# Patient Record
Sex: Female | Born: 1940 | Race: White | Hispanic: No | Marital: Married | Smoking: Former smoker
Health system: Southern US, Community
[De-identification: ages and names within clinical notes are randomized; demographics above are authoritative.]

## PROBLEM LIST (undated history)

## (undated) DIAGNOSIS — I10 Essential (primary) hypertension: Secondary | ICD-10-CM

## (undated) DIAGNOSIS — K219 Gastro-esophageal reflux disease without esophagitis: Secondary | ICD-10-CM

## (undated) DIAGNOSIS — R079 Chest pain, unspecified: Secondary | ICD-10-CM

## (undated) DIAGNOSIS — G4733 Obstructive sleep apnea (adult) (pediatric): Secondary | ICD-10-CM

## (undated) HISTORY — PX: OTHER SURGICAL HISTORY: SHX169

## (undated) HISTORY — DX: Essential (primary) hypertension: I10

## (undated) HISTORY — PX: KNEE ARTHROSCOPY: SUR90

## (undated) HISTORY — DX: Gastro-esophageal reflux disease without esophagitis: K21.9

## (undated) HISTORY — DX: Obstructive sleep apnea (adult) (pediatric): G47.33

---

## 1999-02-26 ENCOUNTER — Ambulatory Visit: Admission: RE | Admit: 1999-02-26 | Discharge: 1999-02-26 | Payer: Self-pay | Admitting: Internal Medicine

## 2000-02-12 ENCOUNTER — Encounter: Admission: RE | Admit: 2000-02-12 | Discharge: 2000-02-12 | Payer: Self-pay | Admitting: Internal Medicine

## 2000-02-12 ENCOUNTER — Encounter: Payer: Self-pay | Admitting: Internal Medicine

## 2000-02-18 ENCOUNTER — Encounter: Payer: Self-pay | Admitting: Internal Medicine

## 2000-02-18 ENCOUNTER — Encounter: Admission: RE | Admit: 2000-02-18 | Discharge: 2000-02-18 | Payer: Self-pay | Admitting: Internal Medicine

## 2000-03-18 ENCOUNTER — Encounter: Payer: Self-pay | Admitting: Obstetrics and Gynecology

## 2000-03-21 ENCOUNTER — Inpatient Hospital Stay (HOSPITAL_COMMUNITY): Admission: RE | Admit: 2000-03-21 | Discharge: 2000-03-24 | Payer: Self-pay | Admitting: Obstetrics and Gynecology

## 2000-03-21 ENCOUNTER — Encounter (INDEPENDENT_AMBULATORY_CARE_PROVIDER_SITE_OTHER): Payer: Self-pay

## 2000-03-25 ENCOUNTER — Emergency Department (HOSPITAL_COMMUNITY): Admission: EM | Admit: 2000-03-25 | Discharge: 2000-03-26 | Payer: Self-pay

## 2001-10-24 ENCOUNTER — Encounter: Admission: RE | Admit: 2001-10-24 | Discharge: 2001-10-24 | Payer: Self-pay | Admitting: Gastroenterology

## 2001-10-24 ENCOUNTER — Encounter: Payer: Self-pay | Admitting: Gastroenterology

## 2001-10-25 ENCOUNTER — Inpatient Hospital Stay (HOSPITAL_COMMUNITY): Admission: EM | Admit: 2001-10-25 | Discharge: 2001-10-27 | Payer: Self-pay | Admitting: Gastroenterology

## 2001-12-07 ENCOUNTER — Ambulatory Visit (HOSPITAL_COMMUNITY): Admission: RE | Admit: 2001-12-07 | Discharge: 2001-12-07 | Payer: Self-pay | Admitting: Gastroenterology

## 2002-07-10 ENCOUNTER — Encounter: Payer: Self-pay | Admitting: Internal Medicine

## 2002-07-10 ENCOUNTER — Encounter: Admission: RE | Admit: 2002-07-10 | Discharge: 2002-07-10 | Payer: Self-pay | Admitting: Internal Medicine

## 2002-10-25 ENCOUNTER — Encounter: Admission: RE | Admit: 2002-10-25 | Discharge: 2002-10-25 | Payer: Self-pay | Admitting: Internal Medicine

## 2002-10-25 ENCOUNTER — Encounter: Payer: Self-pay | Admitting: Internal Medicine

## 2004-02-18 ENCOUNTER — Encounter: Admission: RE | Admit: 2004-02-18 | Discharge: 2004-02-18 | Payer: Self-pay | Admitting: Internal Medicine

## 2004-09-09 ENCOUNTER — Encounter: Admission: RE | Admit: 2004-09-09 | Discharge: 2004-09-09 | Payer: Self-pay | Admitting: Internal Medicine

## 2005-03-08 ENCOUNTER — Emergency Department (HOSPITAL_COMMUNITY): Admission: EM | Admit: 2005-03-08 | Discharge: 2005-03-08 | Payer: Self-pay | Admitting: Emergency Medicine

## 2005-05-18 ENCOUNTER — Encounter: Admission: RE | Admit: 2005-05-18 | Discharge: 2005-05-18 | Payer: Self-pay | Admitting: Surgery

## 2005-06-01 ENCOUNTER — Inpatient Hospital Stay (HOSPITAL_COMMUNITY): Admission: RE | Admit: 2005-06-01 | Discharge: 2005-06-06 | Payer: Self-pay | Admitting: Surgery

## 2005-06-01 ENCOUNTER — Encounter (INDEPENDENT_AMBULATORY_CARE_PROVIDER_SITE_OTHER): Payer: Self-pay | Admitting: Specialist

## 2005-06-15 ENCOUNTER — Encounter: Admission: RE | Admit: 2005-06-15 | Discharge: 2005-06-15 | Payer: Self-pay | Admitting: Obstetrics and Gynecology

## 2005-07-20 ENCOUNTER — Encounter: Admission: RE | Admit: 2005-07-20 | Discharge: 2005-07-20 | Payer: Self-pay | Admitting: Surgery

## 2005-10-18 ENCOUNTER — Inpatient Hospital Stay (HOSPITAL_COMMUNITY): Admission: AD | Admit: 2005-10-18 | Discharge: 2005-10-19 | Payer: Self-pay | Admitting: Surgery

## 2005-10-19 ENCOUNTER — Encounter (INDEPENDENT_AMBULATORY_CARE_PROVIDER_SITE_OTHER): Payer: Self-pay | Admitting: *Deleted

## 2005-12-23 ENCOUNTER — Encounter: Admission: RE | Admit: 2005-12-23 | Discharge: 2005-12-23 | Payer: Self-pay | Admitting: Internal Medicine

## 2007-01-05 ENCOUNTER — Encounter: Admission: RE | Admit: 2007-01-05 | Discharge: 2007-01-05 | Payer: Self-pay | Admitting: Internal Medicine

## 2007-01-24 ENCOUNTER — Ambulatory Visit: Payer: Self-pay | Admitting: Internal Medicine

## 2007-02-03 ENCOUNTER — Ambulatory Visit: Payer: Self-pay | Admitting: Internal Medicine

## 2007-02-06 ENCOUNTER — Ambulatory Visit: Payer: Self-pay | Admitting: Internal Medicine

## 2007-06-09 ENCOUNTER — Encounter: Payer: Self-pay | Admitting: Internal Medicine

## 2007-06-09 DIAGNOSIS — I10 Essential (primary) hypertension: Secondary | ICD-10-CM | POA: Insufficient documentation

## 2007-06-17 ENCOUNTER — Encounter: Payer: Self-pay | Admitting: Internal Medicine

## 2009-03-07 ENCOUNTER — Encounter: Admission: RE | Admit: 2009-03-07 | Discharge: 2009-03-07 | Payer: Self-pay | Admitting: Internal Medicine

## 2009-12-01 ENCOUNTER — Emergency Department (HOSPITAL_COMMUNITY): Admission: EM | Admit: 2009-12-01 | Discharge: 2009-12-01 | Payer: Self-pay | Admitting: Emergency Medicine

## 2010-06-25 ENCOUNTER — Other Ambulatory Visit: Payer: Self-pay | Admitting: Internal Medicine

## 2010-06-26 ENCOUNTER — Ambulatory Visit
Admission: RE | Admit: 2010-06-26 | Discharge: 2010-06-26 | Disposition: A | Discharge status: Home / Self Care | Payer: Medicare Other | Source: Ambulatory Visit | Attending: Internal Medicine | Admitting: Internal Medicine

## 2010-08-01 LAB — DIFFERENTIAL
Lymphocytes Relative: 35 % (ref 12–46)
Lymphs Abs: 2.5 10*3/uL (ref 0.7–4.0)
Monocytes Relative: 9 % (ref 3–12)

## 2010-08-01 LAB — URINALYSIS, ROUTINE W REFLEX MICROSCOPIC
Hgb urine dipstick: NEGATIVE
Nitrite: NEGATIVE
Specific Gravity, Urine: 1.015 (ref 1.005–1.030)
Urobilinogen, UA: 0.2 mg/dL (ref 0.0–1.0)

## 2010-08-01 LAB — COMPREHENSIVE METABOLIC PANEL
Albumin: 3.8 g/dL (ref 3.5–5.2)
Alkaline Phosphatase: 39 U/L (ref 39–117)
CO2: 24 mEq/L (ref 19–32)
Chloride: 103 mEq/L (ref 96–112)
GFR calc Af Amer: >60 mL/min (ref >60)
GFR calc non Af Amer: >60 mL/min (ref >60)
Sodium: 136 mEq/L (ref 135–145)
Total Bilirubin: 0.4 mg/dL (ref 0.3–1.2)
Total Protein: 6.5 g/dL (ref 6.0–8.3)

## 2010-08-01 LAB — CBC
RBC: 4.3 MIL/uL (ref 3.87–5.11)
WBC: 7.4 10*3/uL (ref 4.0–10.5)

## 2010-08-01 LAB — LACTIC ACID, PLASMA: Lactic Acid, Venous: 0.8 mmol/L (ref 0.5–2.2)

## 2010-09-29 NOTE — Assessment & Plan Note (Signed)
[facility]                             PULMONARY OFFICE NOTE   NAME:[name], [name]                  MRN:          [id number]  DATE:01/24/2007                            DOB:          [birth year]    REASON FOR CONSULTATION:  Pulmonary infiltrates and dyspnea.   HISTORY:  A 70 year old white female who says she was perfectly well  until arrived in [place] on August 4 to watch her grandson play  baseball.  Six days after that she began having headache, hacking cough  with fever, sore throat and then began producing green mucus.  She was  initially started on a Z-Pak, switched over to Avelox and now is on  doxycycline and feels 300% better.  The only thing left is sensation  that she is not getting a deep breath on the left lung.  This led to a  workup including a CT scan suggesting bilateral ground glass changes and  I was asked to see her by Dr. Earl Gala.   Note that all of her acute symptoms have resolved including the green  mucus and cough.  She denies any significant dyspnea presently with  exertion, orthopnea, PND, myalgias, arthralgias, fevers, chills, sweats,  orthopnea, PND or leg swelling, overt sinus or reflux symptoms.  Her  symptoms of congestion in the left chest seem to be worse when she  lies down at night or on her left side.   PAST MEDICAL HISTORY:  1. Hypertension.  2. Multiple knee surgeries.   ALLERGIES:  PENICILLIN.   MEDICATIONS:  Taken in detail on the worksheet column dated January 24, 2007 and significant for the fact she is on a prednisone taper.   SOCIAL HISTORY:  She has quit smoking in 1980, had no respiratory  complaints then.  She has worked as an Production designer, theatre/television/film.   FAMILY HISTORY:  Positive for cancer of the lung in her brother.  Her  mother had breast cancer, metastatic to lung apparently.   REVIEW OF SYSTEMS:  Taken in detail on the worksheet, negative except as  outlined above.   PHYSICAL  EXAMINATION:  This is a pleasant ambulatory mildly obese white  female in no acute distress.  She was afebrile, normal vital signs.  HEENT:  Unremarkable, nasal turbinates normal, oropharynx is clear,  dentition is intact, ear canals are clear bilaterally.  NECK:  Supple without cervical adenopathy or tenderness, trachea is  midline, no thyromegaly.  LUNGS:  Fields perfectly clear bilaterally to auscultation and  percussion.  Regular rate and rhythm without murmur, gallop or rub.  ABDOMEN:  Soft, benign without palpable organomegaly, masses or  tenderness.  EXTREMITIES:  Warm without calf tenderness, cyanosis, clubbing or edema.   Hemoglobin saturation of 94% on room air.   CT scan of the chest was reviewed from August 21 and shows bilateral  very subtle ill-defined ground glass changes.  Chest x-ray today was  essentially normal and the area on the left lung that was previously  seen on plain film.   IMPRESSION:  Resolving bronchopneumonia of unclear etiology.  She was in  [place] when  this occurred which probably has nothing to do with her  symptoms but rather had just traveled by air which I believe is a  significant risk factor for respiratory tract infections of all type and  also flare up of sinus disease, although that does not particularly fit  this patient.  I agree completely with Dr. Newell Coral empiric approach  and simply recommended the patient return here in 2 weeks is not 100%  baseline.  In the meantime I recommended the following.  I think it is fine to stop all of her respiratory medications and also  reminded her not to use cough drops, mint or menthol products since  these promote reflux which exacerbate cough and congestion, her main  residual respiratory complaint.  For cough and congestion I think it is  fine to use Mucinex DM 2 b.i.d. but no need for Ventolin here.  She  should finish her prednisone and doxycycline and continue to take  Protonix 30-60  minutes before her first meal daily along with Zegerid 40  mg at bedtime since she says some of her symptoms are worse when she  lies down but I only gave her 15 days and at the end of 2 weeks if she  is not 100% back to baseline (note she said she was 300% of baseline  which indicates to me she does not understand the question so one has to  be careful interpreting the answer.  I have recommended she return here  for further followup otherwise followup p.r.n.     Diana [name]. Sherene Sires, MD, [facility]  Electronically Signed    MBW/MedQ  DD: 01/24/2007  DT: 01/25/2007  Job #: [id number]   cc:   Theressa Millard, M.D.

## 2010-09-29 NOTE — Assessment & Plan Note (Signed)
[facility]                             PULMONARY OFFICE NOTE   Diana [name], Diana [name]                  MRN:          [id number]  DATE:02/03/2007                            DOB:          Aug 18, 1940    REFERRING PHYSICIAN:  Theressa Millard, M.D.   This is a pulmonary extended summary final followup office visit.   HISTORY:  This is a 70 year old white female seen at Dr. Newell Coral  request for coughing, dyspnea, and shortness of breath that all began  abruptly after a trip to [place] in August.  She had very minimal  ground glass changes on CT scan on August 21st, but what was reported to  me she was 300% better.  In fact today she says she was never more  than 95% better and misunderstood the question.  The 5% that is still  left is she has a little bit of cough in the morning when she gets up  with thick mucus and dyspnea with heavy exertion.  She denies any  orthopnea, PND, or leg swelling.  She is no longer needing any form of  inhalers.  I recommended Mucinex DM p.r.n. cough but she has not  actually filled the prescription.  She is on maximum treatment directed  at reflux with both Protonix in the morning and Zegerid at bedtime and  feels much better in terms of nighttime symptoms with no more chest  congestion when she lies down at night.  Note that she finished  prednisone just 3 days ago and has not lost any ground in terms of any  symptoms since the prednisone was stopped.   PHYSICAL EXAMINATION:  GENERAL:  She is a pleasant, ambulatory.  moderately obese, white female in no acute distress.  VITAL SIGNS:  Afebrile with normal vital signs.  HEENT:  Remarkably unremarkable.  Nasal turbinates are normal.  Oropharynx is clear.  NECK:  Supple without cervical adenopathy or tenderness.  Trachea is  midline.  No thyromegaly.  LUNGS:  Fields perfectly clear bilaterally on auscultation percussion  with no cough elicited on inspiratory or  expiratory maneuvers .  HEART:  There is a regular rhythm without murmur, gallop, or rub.  ABDOMEN:  Soft, benign.  EXTREMITIES:  Warm without calf tenderness, cyanosis, clubbing, edema.   Chest x-ray is pending.   IMPRESSION:  A somewhat atypical upper respiratory tract infection with  infiltrates on chest x-ray that would suggest a component of pneumonia,  although the clinical course is a bit unusual and it suggests the  possibility of other entities such as sinusitis with intermittent post  nasal drip syndrome or even one of the eosinophilic lung syndromes or  BOOP.   I have not actually done any blood work on her and one reason I did not  is because she continues to report that she is 95% better compared to  when she was at her worst.  Obviously, if she looses ground off of  prednisone, it would indicate a more extensive workup is necessary which  probably should include a repeat CT scan of the chest along with CBC  with differential looking for eosinophils and a sed rate.   For now, I simply reiterated the recommendations I made previously and  that is to continue to treat reflux aggressively (note that all of her  nighttime chest symptoms were eliminated on Zegerid at bedtime which  probably she should continue and at this point stop the Protonix in the  morning.  If she is short of breath, she can certainly use Ventolin.  If  she is coughing, she should use the Mucinex DM 2 b.i.d.  But if she is  not 100% better by the end of 2 weeks, I would proceed with the above  workup and I have offered to either have her seen here to arrange this  or alternatively she could certainly see Dr. Earl Gala for the above  workup.     Charlaine Dalton. Sherene Sires, MD, [facility]  Electronically Signed    MBW/MedQ  DD: 02/03/2007  DT: 02/03/2007  Job #: [id number]   cc:   Theressa Millard, M.D.

## 2010-10-02 NOTE — Op Note (Signed)
Diana [name], Diana [name]           ACCOUNT NO.:  [id number]   MEDICAL RECORD NO.:  [id number]          PATIENT TYPE:  INP   LOCATION:  5708                         FACILITY:  [facility]   PHYSICIAN:  John C. Madilyn Fireman, M.D.    DATE OF BIRTH:  1940/11/03   DATE OF PROCEDURE:  10/19/2005  DATE OF DISCHARGE:  10/19/2005                                 OPERATIVE REPORT   PROCEDURE:  Colonoscopy with biopsy.   INDICATIONS FOR PROCEDURE:  Hematochezia.   DESCRIPTION OF PROCEDURE:  The patient was placed in the left lateral  decubitus position and placed on the pulse monitor with continuous low-flow  oxygen delivered by nasal cannula.  She was sedated with 35 mcg IV fentanyl  and 4 mg IV Versed.  The Olympus video colonoscope was inserted into the  rectum and advanced to the cecum, confirmed by transillumination of  McBurney's point and visualization of the ileocecal valve and appendiceal  orifice.  The prep was excellent.  The cecum, ascending, transverse, and  descending colon all appeared normal with no masses, polyps, diverticula or  other mucosal abnormalities.  Within the sigmoid colon, there was a small  polyp that was fulgurated by hot biopsy.  5 cm distal to this was the  surgical anastomosis with lots of suture material and granulation tissue and  one cherry red spot just adjacent to the suture with no active bleeding.  There was no blood seen anywhere in the colon.  No biopsies were taken.  The  mucosa distal to the anastomosis appeared normal all the way down to the  rectum where a retroflex view of the anus revealed no obvious internal  hemorrhoids.  The scope was then withdrawn and the patient returned to the  recovery room in stable condition.  She tolerated the procedure well.  There  were no immediate complications.   IMPRESSION:  1.  Small sigmoid polyp.  2.  Granulation tissue with possible stigma of hemorrhage at previous      surgical anastomosis.   PLAN:  Await biopsy  results and observe for further bleeding.           ______________________________  Everardo All. Madilyn Fireman, M.D.     JCH/MEDQ  D:  10/19/2005  T:  10/19/2005  Job:  [id number]   cc:   Sandria Bales. Ezzard Standing, M.D.  [address], [address]  [place]  [place] [address]

## 2010-10-02 NOTE — Op Note (Signed)
NAMEVONYA, Diana [name]           ACCOUNT NO.:  [id number]   MEDICAL RECORD NO.:  [id number]          PATIENT TYPE:  INP   LOCATION:  X006                         FACILITY:  [facility]   PHYSICIAN:  Sandria Bales. Ezzard Standing, M.D.  DATE OF BIRTH:  09-Feb-1941   DATE OF PROCEDURE:  06/01/2005  DATE OF DISCHARGE:                                 OPERATIVE REPORT   PREOPERATIVE DIAGNOSIS:  Recurrent sigmoid colon/left colon diverticulitis.   POSTOPERATIVE DIAGNOSIS:  Recurrent sigmoid colon/left colon diverticulitis.   PROCEDURE:  Laparoscopic-assisted sigmoid/distal left colon resection with  mobilization of splenic flexure laparoscopically.   SURGEON:  Dr. Ezzard Standing   FIRST ASSISTANT:  Dr. Baruch Merl   ANESTHESIA:  General endotracheal.   ESTIMATED BLOOD LOSS:  200 mL.   DRAINS LEFT IN:  None.   INDICATIONS FOR PROCEDURE:  Diana [name] is a 70 year old white female, who  has had recurrent diverticular attacks in the last 2-3 years.  She now comes  for laparoscopic-assisted/ sigmoid/left colon resection.  She underwent a  barium enema on the 2nd of January which shows that most of her diverticular  disease is in the sigmoid colon and distal left colon.   The indications and potential complications of the operation were explained  to the patient.  Potential complications including, but not limited to,  bleeding, infection, leakage of bowel, recurrent diverticular disease.   DESCRIPTION OF OPERATION:  The patient placed in a lithotomy position with  both of her arms tucked to her side.  She had prior knee surgery, and we  were careful in trying to position her to not put any stress on her knees.  She had a Foley catheter in place, an NG tube in place.  Her abdomen and  perineum were prepped with Betadine solution and sterilely draped.   I started out with an infraumbilical incision, placing a 12 mm Hasson  trocar.  I secured this with 0 Vicryl suture.  I then used a 10 mm zero and  angled  scopes, and I placed three 5 mm trocars, 1 in the left lower  quadrant, 1 in the right lower quadrant, and 1 right upper quadrant.  The  patient had some adhesions of her omentum to her anterior abdominal wall  which were taken down with the harmonic scalpel.  I then identified the  sigmoid colon, mobilized the sigmoid colon up over the pelvic brim.  I  mobilized the left colon along the left colonic gutter, identified the  splenic flexure.  I took the omentum off of the left transverse colon for  about 15-20 cm.  I mobilized the splenic flexure down so I had most of the  left colon mobilized.  At this time, I then converted to an open operation  to try to keep my incision below the umbilicus.  I went through a lower  midline incision. I then went down and freed up her sigmoid colon which was  kind of curled in the pelvis.  This was due, I think, both to her prior  diverticular disease and prior hysterectomy.   At this point, we were well off posterior  dissection and this was  diverticular disease, we really were not going very far down on the  mesentery, so I really stayed superficial of the mesentery to stay well away  from the ureter.  I found what I thought was the most distal diverticula  which was about 7-8 cm proximal to the peritoneal reflection.  I divided the  distal sigmoid colon to this region.  I then divided the mesentery with  Kelly clamps and 2-0 silk ties, got to the proximal bowel, which was I think  sort of mid left colon.  I thought this went easily down to where I divided  the distal bowel.  I marked the proximal end of the resected specimen with a  suture.   I then did an end-to-end hand sewn anastomosis with 2-0 silk suture.  I  thought there was no tension on the anastomosis.  I then placed stitches on  both sides of the anastomosis to kind of support the anastomosis.  At this  point, Dr. Colin Benton went below, did a sigmoidoscopy on the patient.  She did  not  visualize it, but she did pump up the rectum with air while I put a  clamp proximal to the anastomosis, and I submerged this under water.  There  was no leakage of any air at the anastomosis.  The anastomosis looked viable  with good blood supply.  There was no leak with the under water air test.   I then irrigated the abdomen with about 3-4 L of saline.  She had 1 little  bleeder to the right distal of the colon.  These were oversewn with a couple  of silk sutures.  I then made sure the NG tube was in good position in the  stomach.  The small bowel was returned to its normal location.  The omentum  laid under the incision.  Sponge and needle count were correct at the end of  the case.  I then closed the abdomen with 2 running #1 PDS sutures with some  interrupted #1 PDS sutures.   The skin was then closed with skin guns, and then I placed a couple of Telfa  wicks in the wound.   The patient tolerated the procedure well and was transported to the recovery  room in good condition.  Sponge and needle count were correct at the end of  the case.      Sandria Bales. Ezzard Standing, M.D.  Electronically Signed     DHN/MEDQ  D:  06/01/2005  T:  06/01/2005  Job:  [id number]   cc:   Theressa Millard, M.D.  Fax: [phone]   Danise Edge, M.D.  Fax: [phone]

## 2010-10-02 NOTE — Discharge Summary (Signed)
[facility]  Patient:    Diana [name], Diana [name]                MRN: [id number] Adm. Date:  [id number] Disc. 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  OPERATION PERFORMED:  Burch procedure and posterior repair.  BRIEF HISTORY:  Ms. Azbell is a 70 year old female status post hysterectomy with persistent genuine stress urinary incontinence and a large rectocele for repair. Significant in her history is that she had mild hypertension and a history of a pulmonary embolus postop and a hysterectomy.  Initial physical examination revealed only the urethral mobility and a large rectocele.  LABORATORY DATA:  Hemoglobin on admission was 14, coagulation profile was normal.  Chest x-ray and cardiogram preoperatively were normal.  HOSPITAL COURSE:  The patient was admitted to the hospital, underwent an uneventful burch procedure and posterior repair. Her postoperative course was uncomplicated. On the day of discharge, she was having a lot of frequency and cath urine revealed a high residual urine so we inserted the Foley and sent her home with Cipro 500 mg daily and she will remove it on Monday when we take her sutures out. She will continue taking her Percocet for pain and her Ambien for sleep. She will call for any other discomfort or problems she may have.  CONDITION ON DISCHARGE:  Improved. DD:  04/06/00 TD:  04/08/00 Job: [id number]

## 2010-10-02 NOTE — Discharge Summary (Signed)
NAMELYSA, LIVENGOOD           ACCOUNT NO.:  [id number]   MEDICAL RECORD NO.:  [id number]          PATIENT TYPE:  INP   LOCATION:  1615                         [facility]   PHYSICIAN:  Sandria Bales. Ezzard Standing, M.D.  DATE OF BIRTH:  1940/10/30   DATE OF ADMISSION:  06/01/2005  DATE OF DISCHARGE:  06/06/2005                                 DISCHARGE SUMMARY   DISCHARGE DIAGNOSES:  1.  Diverticulosis of left/sigmoid colon.  2.  Hypertension.  3.  Arthritic complaints of knees.  4.  Remote history of deep vein thrombosis.   OPERATIONS PERFORMED:  The patient underwent a laparoscopic-assisted  sigmoid/distal left colon resection with mobilization of the splenic flexure  on June 01, 2005.   HISTORY OF PRESENT ILLNESS:  Ms. Klar is a 70 year old white female,  patient of Dr. Benjaman Kindler, who has had recurrent diverticulitis over the  last two to three years. I actually took care of her mother, Kristeen Mans  Joanette Gula, who is now dead.   She has had multiple treatments with antibiotics and has had increasingly  frequent symptoms. She had a CT scan report dated February 18, 2004 which  noted sigmoid colon diverticulosis. She said she had a colonoscopy by Dr.  Laural Benes in 2004 where she was found to have some polyps but otherwise had a  benign colonoscopy exam.   She now comes for attempted colon resection for recurrent diverticulitis.   PAST MEDICAL HISTORY:  1.  She did have a history of DVT after hysterectomy about 20 years ago. She      was placed on Coumadin for a period of time but has done well since that      time.  2.  She has had knee replacements at [facility].  3.  She has had hypertension but never had any clear chest pain.   Ms. Ringgenberg completed an antibiotic bowel prep at home and came to the  hospital on January 16 where she underwent a laparoscopically assisted  sigmoid/distal left colon resection with mobilization of the splenic  flexure.   Postoperatively, she did  well. On her first postoperative day, her  hemoglobin was 11, white blood count of 11,100, sodium 138, 4.0, creatinine  0.9. She was placed on DVT prophylaxis during her hospitalization. Her NG  tube was removed on the second postoperative day. By the fourth  postoperative day, she had a bowel movement, was started on clear liquids  which she tolerated, and by the fifth postoperative day, she was ready for  discharge.   She was sent home on Vicodin for pain. She should slowly increase her  activity. No driving for a couple of days. She will see me back in about  one's week for wound check. She was call to any interval problems or  questions.   Her final pathology showed colon segmental resection with diverticulosis but  no malignancy.   Her discharge condition was good.      Sandria Bales. Ezzard Standing, M.D.  Electronically Signed     DHN/MEDQ  D:  06/16/2005  T:  06/16/2005  Job:  [id number]   cc:  Theressa Millard, M.D.  Fax: [phone]   Danise Edge, M.D.  Fax: [phone]

## 2010-10-02 NOTE — Consult Note (Signed)
Diana [name], Diana [name]           ACCOUNT NO.:  [id number]   MEDICAL RECORD NO.:  [id number]          PATIENT TYPE:  INP   LOCATION:  5708                         FACILITY:  [facility]   PHYSICIAN:  Bernette Redbird, M.D.   DATE OF BIRTH:  01/08/1941   DATE OF CONSULTATION:  DATE OF DISCHARGE:                                   CONSULTATION    <GASTROENTEROLOGY  CONSULTATION/>  Dr. Ezzard Standing asked [place] to see this 70 year old female because of lower GI  bleeding.   Ms. Filice is approximately 6 months status post a laparoscopically-  assisted sigmoid colectomy for recurrent diverticulitis, but it is known  that residual diverticular disease was left behind.   The patient has never had frank GI bleeding although she has seen some self-  limited rectal bleeding from time to time of the past.   With that background, she was in her usual state of health until about 12  noon today when she had several bowel movements of fairly large amounts of  blood mixed with stool which was loose in character, and associated with  moderately severe abdominal cramps.  The bleeding then tapered off, but  seemed to start recurring again this evening, with the amount increasing as  recently as 30 minutes ago.  She has not had any orthostatic symptomatology.  In fact, her hemoglobin on presentation to the hospital here is 13.5.   PAST MEDICAL HISTORY:  Allergy to PENICILLIN (difficulty breathing and  severe swelling).   CURRENT MEDICATIONS:  Atenolol, HCTZ and estradiol.   PAST SURGICAL HISTORY:  Hysterectomy, total knee replacement, sigmoid  colectomy.   PAST MEDICAL HISTORY:  Hypertension, but no known cardiopulmonary disease or  diabetes.   HABITS:  Rare ethanol, but nonsmoker.   FAMILY HISTORY:  Diverticular disease in her father who may also have had  colon cancer (details unclear), but he died of a heart attack.   SOCIAL HISTORY:  The patient is married and accompanied by her husband in  the  hospital room.  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   REVIEW OF SYSTEMS:  Over the past month, the patient has had a tendency  toward reflux or indigestion, treated with Tums.  No problem with  constipation (reports a normal daily bowel movement at baseline).  No  trouble swallowing, no ongoing intestinal symptoms other than the above-  mentioned heartburn.   PHYSICAL EXAMINATION:  GENERAL:  A pleasant, articulate Caucasian female in  no evident distress.  Neither anxious nor depressed.  She is without pallor  or icterus.  VITAL SIGNS:  Vital signs include blood pressure 197/91, pulse 65, afebrile,  respirations 20 and nonlabored.  SKIN:  The skin is warm and dry.  The radial pulses full and slow consistent  with a atenolol usage.  CHEST:  Clear.  HEART:  Normal.  ABDOMEN:  Abdomen has normal bowel sounds and no organomegaly, no guarding,  mass or tenderness.  It is a very benign abdomen.  RECTAL:  Exam shows absolutely no blood or  stool, just mucoid clear residue   LABORATORY DATA:  Labs white count 6300, hemoglobin 13.5 with MCV of 90,  platelets 263,000.  INR 0.9, BUN 14 with creatinine of 0.8.  Liver  chemistries normal.   IMPRESSION:  1.  Recurrent hematochezia without significant hemodynamic instability or      significant drop in hemoglobin.  2.  Significant crampy lower abdominal pain in association with the above-      mentioned bleeding.  3.  Status post sigmoid colectomy for recurrent diverticulitis, with history      of known residual diverticular disease.   DISCUSSION AND PLAN:  The overall picture to me sounds somewhat suggestive  of ischemic colitis in view of the abrupt onset and a large component of  pain in association with a relatively small volume of blood loss (as  indicated by the normal hemoglobin on admission, and the hemodynamic  stability).  This could  certainly be a diverticular hemorrhage, but the  quantity of blood loss does not seem to be sufficient for the typical  diverticular bleed.  Infectious colitis will be another possibility although  the volume of the diarrhea, in relation to the degree of cramps and  bleeding, does not seem to be sufficient.   RECOMMENDATIONS:  I would favor colonoscopic evaluation to help clarify the  clinical picture.  The patient is familiar with the procedure from when Dr.  Laural Benes did a colonoscopy several years ago and does wish to proceed.  We  will initiate gentle prep tonight, with the anticipation that not that  extensive of a prep will be needed to clear her out sufficiently for a  colonoscopy examination.   We appreciate the opportunity to have seen this patient in consultation with  you.           ______________________________  Bernette Redbird, M.D.     RB/MEDQ  D:  10/18/2005  T:  10/19/2005  Job:  [id number]   cc:   Sandria Bales. Ezzard Standing, M.D.  [address]  [place]  [place] [address]   Theressa Millard, M.D.  Fax: [phone]   Danise Edge, M.D.  Fax: [phone]

## 2010-10-02 NOTE — H&P (Signed)
[facility]  Patient:    Diana [name], Diana [name] Visit Number: [id number] MRN: [id number]          Service Type: EMS Location: ED Attending Physician:  Shelba Flake Dictated by:   Everardo All Madilyn Fireman, M.D. Admit Date:  10/24/2001 Discharge Date: 10/25/2001   CC:         Verlin Grills, M.D.  Tyson Dense, M.D.   History and Physical  CHIEF COMPLAINT:  Abdominal pain.  HISTORY OF PRESENT ILLNESS:  The patient is a 70 year old white female admitted with a two-day history of worsening lower abdominal pain, feverishness, and malaise.  She was seen yesterday by Dr. Eula Listen and Dr. Laural Benes, and diverticulitis was suspected and the patient started on p.o. Cipro.  An abdominal CT scan was obtained this evening, and I received a call from the radiologist stating that it showed significant diverticulitis with some air in the bowel wall.  She was instructed to come to the emergency room for admission.  She denies any vomiting.  She has had some nausea and some mild abdominal distention.  She has not had any severe fecal obstipation, diarrhea, or rectal bleeding.  Denies any urinary symptoms.  She has never had diverticulitis in the past.  PAST MEDICAL HISTORY: 1. Hypertension. 2. History of DVT after gynecologic surgery.  PAST SURGICAL HISTORY: 1. Bilateral knee surgeries in the past. 2. Repair of vaginal prolapse, TAH with BSO and incidental appendectomy.  MEDICATIONS: 1. Atenolol 50 mg a day. 2. Hydrochlorothiazide 25 mg a day. 3. Estrogen replacement.  ALLERGIES:  None.  SOCIAL HISTORY:  The patient drinks alcohol occasionally.  She denies cigarette smoking.  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  FAMILY HISTORY:  Her mother died of lung and breast cancer.  Father had coronary artery disease.  PHYSICAL EXAMINATION:  VITAL SIGNS:  Temperature 98.7, blood pressure 132/66, heart rate 96, respirations 18.  GENERAL:  A  well-developed, well-nourished white female in no acute distress.  HEENT:  Unremarkable.  CHEST:  Clear.  CARDIAC:  Regular rate and rhythm without murmur.  ABDOMEN:  Soft, nondistended, with normoactive bowel sounds.  There is moderate lower abdominal tenderness and moderate rebound tenderness.  EXTREMITIES:  Without cyanosis, clubbing, or edema.  LABORATORY DATA:  Hemoglobin 13.7, hematocrit 40, WBC 10,300, platelets 249,000.  Chemistries pending.  IMPRESSION:  Diverticulitis with air inside the bowel, possible developing abscess.  PLAN:  Admit for IV antibiotics, bowel rest, and probable surgical consultation.  She will probably need a repeat CT scan for decisions regarding ongoing medical versus surgical therapy. Dictated by:   Everardo All Madilyn Fireman, M.D. Attending Physician:  Shelba Flake DD:  10/24/01 TD:  10/26/01 Job: [id number]

## 2010-10-02 NOTE — H&P (Signed)
[facility]  Patient:    Diana [name], Diana [name]                    MRN: [id number] Attending:  Katherine Roan, M.D.                         History and Physical  CHIEF COMPLAINT:  Stress urinary incontinence and pelvic pressure.  HISTORY OF PRESENT ILLNESS:  Diana [name] is status post TAH, LSO, and right salpingectomy in 1984, who has a large rectocele and stress incontinence.  She has urge and stress incontinence and pelvic pressure with the feeling that things are falling out.  Because of this, she is prepared for surgery.  Her comorbidity include mild hypertension, esophageal reflux, and a history of pulmonary embolus.  She is a gravida 4, para 61, 70 years old.  MEDICATIONS:  Detrol, atenolol, HydroDIURIL, Prevacid, and Estrace.  ALLERGIES:  NEOSPORIN.  Posthysterectomy, she had a pulmonary embolus, but that was about two weeks postop.  REVIEW OF SYSTEMS:  HEENT:  She wears glasses.  She has no decrease in vision or auditory acuity.  No headaches or dizziness.  HEART:  She is followed for mild hypertension well controlled on atenolol and HydroDIURIL.  No chest pain. No shortness of breath.  No history of rheumatic fever.  No history of heart murmur.  LUNGS:  No chronic cough, no asthma.  She has a history of a pulmonary embolus posthysterectomy in 1984.  GU:  She has stress urinary incontinence, pelvic pressure, and some urge.  She has difficulty with pelvic pressure and feeling that things are falling out.  GI:  No bowel habit change. No weight loss or gain.  No melena.  No decrease in appetite. MUSCLES/BONES/JOINTS:  She has a history of knee surgery.  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  FAMILY HISTORY:  Her mother is living and well at 61.  Father deceased at 23. Father had a mild cardial infarction apparently.  She has one sister and one brother who are in good health.  Mother has cancer of the breast and lung. She  has maternal aunts and uncles with diabetes.  PHYSICAL EXAMINATION:  GENERAL:  Well-developed, well-nourished female who appears to be her stated age of 70.  Weight is 193.  VITAL SIGNS:  Blood pressure is 130/80/  HEENT:  Unremarkable.  Oropharynx is not injected.  NECK:  Supple.  Carotid pulses are equal without bruits.  Trachea is in the midline, and thyroid does not appear to be enlarged.  I detect no adenopathy.  BREASTS:  No masses or tenderness.  Axilla free from adenopathy.  Mammograms have been done on a yearly basis.  LUNGS:  Clear to percussion and auscultation.  Diaphragm sits well with inspiration and expiration.  HEART:  Normal sinus rhythm.  No murmur.  ABDOMEN:  Soft, liver, spleen, nor kidneys are not palpated.  Bowel sounds are normal with no tenderness.  There is a low transverse incision.  EXTREMITIES:  Examination of the extremities show good range of motion and equal pulses and reflexes.  PELVIC:  Urethral mobility, minimal cystocele, and a fairly large rectocele. This extends all the way up to the vaginal apex.  There appears to be no enterocele.  Hemoccult is negative, and no pelvic masses are noted.  IMPRESSION:  Symptomatic stress incontinence and urge and large rectocele.  PLAN:  Burch procedure and posterior repair.  Risks and benefits have been discussed with Lurena Joiner. DD:  03/21/00 TD:  03/21/00 Job: [id number]

## 2010-10-02 NOTE — Op Note (Signed)
[facility]  Patient:    Diana [name], Diana [name]                MRN: [id number] Proc. Date: 03/21/00 Adm. Date:  [id number] Attending:  Lendon Colonel                           Operative Report  PREOPERATIVE DIAGNOSES:  Symptomatic stress urinary incontinence with rectocele, pelvic pressure and advanced stage rectocele and urethral mobility.  POSTOPERATIVE DIAGNOSES:  Symptomatic stress urinary incontinence with rectocele, pelvic pressure and advanced stage rectocele and urethral mobility.  OPERATION PERFORMED:  Burch procedure and posterior repair.  DESCRIPTION OF PROCEDURE:  The patient was placed in lithotomy position, prepped and draped in the usual fashion. A transverse incision was made in the abdomen and carried down to the layers of the fascia which was incised transversely. The rectus insertions were identified and severed and then the retropubic space was entered. Visualization of the retropubic space was accomplished with blunt and sharp dissection. The vagina right at the bladder neck was then identified and dissected and with a finger in the vagina, 2 Ethibond sutures were placed in the paravaginal tissue to the inguinal ligament and the shelving edge of Pouparts ligament. This was done on each side with good elevation of the bladder neck. Irrigation of the retropubic space, the rectus muscles were then attached to the lower transverse fascia of the abdominal wall. The fascia was then closed with a running suture of 2-0 PDS and interrupted Ethibond. Hemostasis appeared secure. The skin was closed with clips. Attention was then drawn to the posterior repair where a wedge of the perineal body was removed and the rectum was then dissected from the underlying vagina. The rectal defect was a complex defect both linear and transverse and was closed with interrupted sutures of 3-0 Vicryl. This affected an excellent closure. Following this,  the vagina was excised and closed with a running locking suture of 2-0 chromic and the vagina was packed with Iodoform pack. The perineal skin was closed with a subcuticular 3-0 chromic and interrupted sutures of 2-0 Vicryl. The abdominal incision was infiltrated with 0.5% Marcaine and the posterior repair was irrigated with 1% xylocaine. [name] tolerated this procedure well and was sent to the recovery room in good condition. DD:  03/21/00 TD:  03/21/00 Job: [id number]

## 2010-10-02 NOTE — Procedures (Signed)
[place]  Patient:    Diana [name], Diana [name] Visit Number: [id number] MRN: [id number]          Service Type: END Location: ENDO Attending Physician:  Dennison Bulla Ii Dictated by:   Verlin Grills, M.D. Proc. Date: 12/07/01 Admit Date:  12/07/2001 Discharge Date: 12/07/2001   CC:         Theressa Millard, M.D.   Procedure Report  PROCEDURE:  Colonoscopy.  REFERRING PHYSICIAN:  Theressa Millard, M.D.  INDICATIONS FOR PROCEDURE:  The patient is a 70 year old female born 28-Mar-1941.  The patient was hospitalized to treat acute diverticulitis approximately four to six weeks ago.  She is scheduled to undergo a follow up diagnostic colonoscopy.  While receiving the colonic lavage prep, she developed hematochezia which has resolved.  ENDOSCOPIST:  Verlin Grills, M.D.  PREMEDICATION:  Versed 5 mg and fentanyl 50 mcg.  ENDOSCOPE:  Olympus pediatric colonoscope.  DESCRIPTION OF PROCEDURE:  After obtaining informed consent, the patient was placed in the left lateral decubitus position.  I administered intravenous Versed and intravenous fentanyl to achieve conscious sedation for the procedure.  The patients blood pressure, oxygen saturation, and cardiac rhythm were monitored throughout the procedure, and documented in the medical record.  Anal inspection was normal.  Digital rectal exam was normal.  The Olympus pediatric video colonoscope was introduced into the rectum and advanced to the cecum.  Colonic preparation for the exam today is satisfactory.  Rectum:  Large nonbleeding internal hemorrhoids, normal rectal mucosa.  Sigmoid colon and descending colon:  Left colonic diverticulosis without diverticulitis or diverticular stricture formation.  Splenic flexure normal.  Transverse colon normal.  Hepatic flexure normal.  Ascending colon normal.  Cecum and ileocecal valve normal.  ASSESSMENT: 1. Large internal  hemorrhoids. 2. Left colonic diverticulosis. 3. No endoscopic evidence for the presence of colorectal neoplasia. Dictated by:   Verlin Grills, M.D. Attending Physician:  Dennison Bulla Ii DD:  12/07/01 TD:  12/10/01 Job: [id number]

## 2010-10-02 NOTE — Discharge Summary (Signed)
NAMEHANIA, CERONE           ACCOUNT NO.:  [id number]   MEDICAL RECORD NO.:  [id number]          PATIENT TYPE:  INP   LOCATION:  5708                         FACILITY:  [facility]   PHYSICIAN:  Sandria Bales. Ezzard Standing, M.D.  DATE OF BIRTH:  1941/03/22   DATE OF ADMISSION:  10/18/2005  DATE OF DISCHARGE:  10/19/2005                                 DISCHARGE SUMMARY   DISCHARGE DIAGNOSES:  1. Lower gastrointestinal bleed, etiology unclear.  2. Status post lap-assisted sigmoid colectomy for diverticular disease.  3. Hypertension.  4. Patient has a colonoscopy by Dr. Madilyn Fireman on October 19, 2005.   HISTORY OF PRESENT ILLNESS:  Ms. Carvey had a sigmoid colectomy for  diverticular disease in approximately January 2007.  She called and  presented to our office with an acute lower GI bleed.   She was hemodynamically stable, though she had a significant amount of  bright red blood by her history.  She was seen, actually, by Dr. Lurene Shadow in  our urgent office on the afternoon of October 18, 2005 and was admitted.  I  spoke with Dr. Matthias Hughs who saw the patient in consultation.  Her initial  hemoglobin was 13.5, hematocrit 39.6.   The following day, she underwent a colonoscopy, which was done by Dr. Dorena Cookey.  He saw friable granulating tissue at her anastomosis.  He saw no  active bleeding or obvious source of bleeding.   She was the discharged home later that day.  Her hemoglobin, the second day,  was 12.1, hematocrit 37, platelet count 242,000.  Her PT was 12.2, PTT of  32.   She was discharged with a lower GI bleed of uncertain etiology, possibly  secondary to the granulation tissue at her anastomosis.  She was to see me  back for her wound checks, anyway, within 2 to 4 weeks.  She knew if she had  any further bleeds she would be back in touch with our office.      Sandria Bales. Ezzard Standing, M.D.  Electronically Signed     DHN/MEDQ  D:  12/16/2005  T:  12/17/2005  Job:  [id number]   cc:   Theressa Millard, M.D.  John C. Madilyn Fireman, M.D.

## 2013-03-20 ENCOUNTER — Encounter: Payer: Self-pay | Admitting: Interventional Cardiology

## 2013-03-20 ENCOUNTER — Encounter: Payer: Self-pay | Admitting: *Deleted

## 2013-03-20 DIAGNOSIS — I2510 Atherosclerotic heart disease of native coronary artery without angina pectoris: Secondary | ICD-10-CM | POA: Insufficient documentation

## 2013-03-20 DIAGNOSIS — K219 Gastro-esophageal reflux disease without esophagitis: Secondary | ICD-10-CM | POA: Insufficient documentation

## 2013-03-20 DIAGNOSIS — G4733 Obstructive sleep apnea (adult) (pediatric): Secondary | ICD-10-CM | POA: Insufficient documentation

## 2013-03-21 ENCOUNTER — Ambulatory Visit (INDEPENDENT_AMBULATORY_CARE_PROVIDER_SITE_OTHER): Payer: Medicare Other | Admitting: Interventional Cardiology

## 2013-03-21 ENCOUNTER — Encounter: Payer: Self-pay | Admitting: Interventional Cardiology

## 2013-03-21 VITALS — BP 140/90 | HR 61 | Ht 68.0 in | Wt 228.0 lb

## 2013-03-21 DIAGNOSIS — R0600 Dyspnea, unspecified: Secondary | ICD-10-CM | POA: Insufficient documentation

## 2013-03-21 DIAGNOSIS — I10 Essential (primary) hypertension: Secondary | ICD-10-CM

## 2013-03-21 DIAGNOSIS — I779 Disorder of arteries and arterioles, unspecified: Secondary | ICD-10-CM | POA: Insufficient documentation

## 2013-03-21 DIAGNOSIS — R0789 Other chest pain: Secondary | ICD-10-CM

## 2013-03-21 DIAGNOSIS — R0609 Other forms of dyspnea: Secondary | ICD-10-CM | POA: Insufficient documentation

## 2013-03-21 LAB — BRAIN NATRIURETIC PEPTIDE: Pro B Natriuretic peptide (BNP): 33 pg/mL (ref 0.0–100.0)

## 2013-03-21 NOTE — Patient Instructions (Signed)
Lab Today: BNP  Your physician has requested that you have a lexiscan myoview. For further information please visit [email or web address] Please follow instruction sheet, as given.  Follow up pending results of BNP and Lexiscan

## 2013-03-21 NOTE — Progress Notes (Signed)
Patient ID: YUVAL NOLET, female   DOB: February 17, 1941, 72 y.o.   MRN: [id number]   Date: 03/21/2013 ID: LORAY AKARD, DOB 01/21/1941, MRN [id number] PCP: Darnelle Bos, MD  Reason: Dyspnea and chest pressure  ASSESSMENT;  1. Chest pressure, near continuous with slight exacerbation during exertion 2. Dyspnea on exertion and orthopnea, progressive over the past 6 months 3. Right carotid bruit with history of carotid artery disease 4. Calcified and aneurysmal splenic artery 5. Hypertension 6. Hyperlipidemia 7. Obstructive sleep apnea  PLAN:  1. BNP 2. Lexiscan nuclear myocardial perfusion study 3. Will probably need repeat carotid Doppler 4. May need 2-D Doppler echocardiogram if the BNP and myocardial perfusion study on revealing.   SUBJECTIVE: HANNI MILFORD is a 72 y.o. female who is  72 year old female with multiple risk factors for arterial sclerosis. She is referred for evaluation of near continuous chest pressure and dyspnea on exertion. Both symptoms have been progressively worsening over the past 6 months. The chest pressure is present even at rest. This seems to be some differential and the severity of the discomfort depending on whether she is laying on her left side right side or flat on her back. There is no radiation of the discomfort to the arm or neck. She does have mild orthopnea and is also noted lower extremity swelling. She denies palpitations. She has not had syncope. She has been relatively sedentary since knee replacement surgery 13 months ago.   Allergies  Allergen Reactions  . Dilaudid [Hydromorphone Hcl]   . Hctz [Hydrochlorothiazide]   . Morphine And Related   . Penicillins     Current Outpatient Prescriptions on File Prior to Visit  Medication Sig Dispense Refill  . aspirin 81 MG tablet Take 81 mg by mouth daily.      . furosemide (LASIX) 20 MG tablet Take 20 mg by mouth.      . losartan (COZAAR) 100 MG tablet Take 100 mg by mouth  daily.      . Multiple Vitamin (MULTIVITAMIN) capsule Take 1 capsule by mouth daily.      Marland Kitchen omeprazole (PRILOSEC) 20 MG capsule Take 20 mg by mouth daily.      . rosuvastatin (CRESTOR) 10 MG tablet Take 10 mg by mouth daily.       No current facility-administered medications on file prior to visit.    Past Medical History  Diagnosis Date  . GERD (gastroesophageal reflux disease)   . CAD (coronary artery disease)   . HTN (hypertension)   . OSA (obstructive sleep apnea)     Past Surgical History  Procedure Laterality Date  . Hysterectomy and bso    . Partial colectomy for recurrent diverticulitis    . Knee arthroscopy    . Fallopian tube cystectomy    . Left tkr    . Osteotomy, right leg x2, with bone graft      History   Social History  . Marital Status: Married    Spouse Name: N/A    Number of Children: N/A  . Years of Education: N/A   Occupational History  . Not on file.   Social History Main Topics  . Smoking status: Former Games developer  . Smokeless tobacco: Not on file     Comment: QUIT SINCE 72YEARS OLD  . Alcohol Use: No     Comment: OCCASIONAL  . Drug Use: No  . Sexual Activity: Not on file   Other Topics Concern  . Not on file   Social History  Narrative  . No narrative on file    No family history on file.  ROS: Denies bleeding, stroke, palpitations, syncope, rash, headache, cough, wheezing, hemoptysis.. Other systems negative for complaints.  OBJECTIVE: BP 140/90  Pulse 61  Ht 5\' 8"  (1.727 m)  Wt 228 lb (103.42 kg)  BMI 34.68 kg/m2  SpO2 93%,  General: No acute distress, mild to moderate obesity HEENT: normal no jaundice, or conjunctival pallor Neck: JVD flat. Carotids right carotid bruit Chest: Clear Cardiac: Murmur: Absent. Gallop: S4. Rhythm: Regular. Other: Normal Abdomen: Bruit: Absent. Pulsation: Absent Extremities: Edema: 1-2+ ankle edema bilateral. Pulses: Trace posterior tibial bilaterally Neuro: Normal Psych: Normal  ECG:  Normal

## 2013-03-23 ENCOUNTER — Telehealth: Payer: Self-pay

## 2013-03-23 NOTE — Telephone Encounter (Signed)
called to give pt results of [email or web address]

## 2013-03-23 NOTE — Telephone Encounter (Signed)
Message copied by Jarvis Newcomer on Fri Mar 23, 2013  9:04 AM ------      Message from: Verdis Prime      Created: Thu Mar 22, 2013  3:57 PM       The screening test demonstrates no concern for fluid build up in lungs. ------

## 2013-04-16 ENCOUNTER — Ambulatory Visit (HOSPITAL_COMMUNITY): Payer: Medicare Other | Attending: Cardiology | Admitting: Radiology

## 2013-04-16 ENCOUNTER — Encounter: Payer: Self-pay | Admitting: Cardiology

## 2013-04-16 VITALS — BP 181/72 | HR 51 | Ht 68.0 in | Wt 227.0 lb

## 2013-04-16 DIAGNOSIS — R0609 Other forms of dyspnea: Secondary | ICD-10-CM | POA: Insufficient documentation

## 2013-04-16 DIAGNOSIS — R42 Dizziness and giddiness: Secondary | ICD-10-CM | POA: Insufficient documentation

## 2013-04-16 DIAGNOSIS — J45909 Unspecified asthma, uncomplicated: Secondary | ICD-10-CM | POA: Insufficient documentation

## 2013-04-16 DIAGNOSIS — R0789 Other chest pain: Secondary | ICD-10-CM

## 2013-04-16 DIAGNOSIS — I779 Disorder of arteries and arterioles, unspecified: Secondary | ICD-10-CM | POA: Insufficient documentation

## 2013-04-16 DIAGNOSIS — R079 Chest pain, unspecified: Secondary | ICD-10-CM

## 2013-04-16 DIAGNOSIS — Z8249 Family history of ischemic heart disease and other diseases of the circulatory system: Secondary | ICD-10-CM | POA: Insufficient documentation

## 2013-04-16 DIAGNOSIS — I2510 Atherosclerotic heart disease of native coronary artery without angina pectoris: Secondary | ICD-10-CM | POA: Insufficient documentation

## 2013-04-16 DIAGNOSIS — R002 Palpitations: Secondary | ICD-10-CM | POA: Insufficient documentation

## 2013-04-16 DIAGNOSIS — R0989 Other specified symptoms and signs involving the circulatory and respiratory systems: Secondary | ICD-10-CM | POA: Insufficient documentation

## 2013-04-16 DIAGNOSIS — Z87891 Personal history of nicotine dependence: Secondary | ICD-10-CM | POA: Insufficient documentation

## 2013-04-16 DIAGNOSIS — I10 Essential (primary) hypertension: Secondary | ICD-10-CM | POA: Insufficient documentation

## 2013-04-16 DIAGNOSIS — E785 Hyperlipidemia, unspecified: Secondary | ICD-10-CM | POA: Insufficient documentation

## 2013-04-16 MED ORDER — TECHNETIUM TC 99M SESTAMIBI GENERIC - CARDIOLITE
33.0000 | Freq: Once | INTRAVENOUS | Status: AC | PRN
  Administered 2013-04-16: 33 via INTRAVENOUS

## 2013-04-16 MED ORDER — REGADENOSON 0.4 MG/5ML IV SOLN
0.4000 mg | Freq: Once | INTRAVENOUS | Status: AC
  Administered 2013-04-16: 0.4 mg via INTRAVENOUS

## 2013-04-16 NOTE — Progress Notes (Signed)
  [facility] SITE 3 NUCLEAR MED [address] [place], [place] [address] [phone]    Cardiology Nuclear Med Study  Diana [name] is a 72 y.o. female     MRN : [id number]     DOB: 06-23-1940  Procedure Date: 04/16/2013  Nuclear Med Background Indication for Stress Test:  Evaluation for Ischemia History:  CAD, MPI 2005 (normal) EF 83%, Asthma (seasonal) Cardiac Risk Factors: Carotid Disease, Family History - CAD, History of Smoking, Hypertension and Lipids  Symptoms:  Chest Pressure (chronic), DOE, Light-Headedness and Palpitations   Nuclear Pre-Procedure Caffeine/Decaff Intake:  None NPO After: 7:00pm   Lungs:  clear O2 Sat: 95% on room air. IV 0.9% NS with Angio Cath:  22g  IV Site: R Hand  IV Started by:  Cathlyn Parsons, RN  Chest Size (in):  40 Cup Size: C  Height: 5\' 8"  (1.727 m)  Weight:  227 lb (102.967 kg)  BMI:  Body mass index is 34.52 kg/(m^2). Tech Comments: n/a    Nuclear Med Study 1 or 2 day study: 2 day  Stress Test Type:  Treadmill/Lexiscan  Reading MD: Verdis Prime, MD  Order Authorizing Provider:  Pauline Good  Resting Radionuclide: Technetium 56m Sestamibi  Resting Radionuclide Dose: 33.0 mCi on 04-17-13  Stress Radionuclide:  Technetium 74m Sestamibi  Stress Radionuclide Dose: 33.0 mCi on 04-16-13          Stress Protocol Rest HR: 51 Stress HR: 85  Rest BP: 181/72 Stress BP: 127/73  Exercise Time (min): n/a METS: n/a   Predicted Max HR: 148 bpm % Max HR: 57.43 bpm Rate Pressure Product: 69629   Dose of Adenosine (mg):  n/a Dose of Lexiscan: 0.4 mg  Dose of Atropine (mg): n/a Dose of Dobutamine: n/a mcg/kg/min (at max HR)  Stress Test Technologist: Nelson Chimes, BS-ES  Nuclear Technologist:  Dario Guardian, CNMT     Rest Procedure:  Myocardial perfusion imaging was performed at rest 45 minutes following the intravenous administration of Technetium 34m Sestamibi. Rest ECG: NSR - Normal EKG  Stress Procedure:  The patient  received IV Lexiscan 0.4 mg over 15-seconds with concurrent low level exercise and then Technetium 63m Sestamibi was injected at 30-seconds while the patient continued walking one more minute.  Quantitative spect images were obtained after a 45-minute delay.  During the infusion of Lexiscan, patient complained of SOB, lightheadedness and a headache.  All symptoms resolved in recovery with the exception of the headache.  Stress ECG: No significant change from baseline ECG  QPS Raw Data Images:  There is a breast shadow that accounts for the anterior attenuation. Stress Images:  Normal homogeneous uptake in all areas of the myocardium. Rest Images:  Normal homogeneous uptake in all areas of the myocardium. Subtraction (SDS):  No evidence of ischemia. Transient Ischemic Dilatation (Normal <1.22):  1.28 Lung/Heart Ratio (Normal <0.45):  0.38  Quantitative Gated Spect Images QGS EDV:  84 ml QGS ESV:  24 ml  Impression Exercise Capacity:  Lexiscan with low level exercise. BP Response:  Normal blood pressure response. Clinical Symptoms:  Mild chest pain/dyspnea. ECG Impression:  No significant ST segment change suggestive of ischemia. Comparison with Prior Nuclear Study: No images to compare  Overall Impression:  Low risk stress nuclear study with anterior fixed defect due to breast attenuation.. No ischemia noted.  LV Ejection Fraction: 71%.  LV Wall Motion:  Normal Wall Motion

## 2013-04-17 ENCOUNTER — Ambulatory Visit (HOSPITAL_COMMUNITY): Payer: Medicare Other | Attending: Cardiology

## 2013-04-17 DIAGNOSIS — R0989 Other specified symptoms and signs involving the circulatory and respiratory systems: Secondary | ICD-10-CM

## 2013-04-17 MED ORDER — TECHNETIUM TC 99M SESTAMIBI GENERIC - CARDIOLITE
33.0000 | Freq: Once | INTRAVENOUS | Status: AC | PRN
  Administered 2013-04-17: 33 via INTRAVENOUS

## 2013-04-24 ENCOUNTER — Telehealth: Payer: Self-pay

## 2013-04-24 NOTE — Telephone Encounter (Signed)
Message copied by Jarvis Newcomer on Tue Apr 24, 2013  1:16 PM ------      Message from: Verdis Prime      Created: Sat Apr 21, 2013 10:35 AM       No significant abnormality to suggest blockage. ------

## 2013-04-24 NOTE — Telephone Encounter (Signed)
pt given results of nuclear study.No significant abnormality to suggest blockage.pt verbalized understanding.

## 2013-07-17 ENCOUNTER — Emergency Department (HOSPITAL_COMMUNITY)
Admission: EM | Admit: 2013-07-17 | Discharge: 2013-07-17 | Disposition: A | Discharge status: Home / Self Care | Payer: Medicare Other | Attending: Emergency Medicine | Admitting: Emergency Medicine

## 2013-07-17 ENCOUNTER — Encounter (HOSPITAL_COMMUNITY): Payer: Self-pay | Admitting: Emergency Medicine

## 2013-07-17 DIAGNOSIS — Z79899 Other long term (current) drug therapy: Secondary | ICD-10-CM | POA: Insufficient documentation

## 2013-07-17 DIAGNOSIS — I2510 Atherosclerotic heart disease of native coronary artery without angina pectoris: Secondary | ICD-10-CM | POA: Insufficient documentation

## 2013-07-17 DIAGNOSIS — Z880 Allergy status to penicillin: Secondary | ICD-10-CM | POA: Insufficient documentation

## 2013-07-17 DIAGNOSIS — R040 Epistaxis: Secondary | ICD-10-CM | POA: Insufficient documentation

## 2013-07-17 DIAGNOSIS — Z7982 Long term (current) use of aspirin: Secondary | ICD-10-CM | POA: Insufficient documentation

## 2013-07-17 DIAGNOSIS — I10 Essential (primary) hypertension: Secondary | ICD-10-CM | POA: Insufficient documentation

## 2013-07-17 DIAGNOSIS — Z87891 Personal history of nicotine dependence: Secondary | ICD-10-CM | POA: Insufficient documentation

## 2013-07-17 DIAGNOSIS — K219 Gastro-esophageal reflux disease without esophagitis: Secondary | ICD-10-CM | POA: Insufficient documentation

## 2013-07-17 NOTE — ED Notes (Signed)
Pt concerned with the feeling that her sinuses are filling with blood after placement of the rapid rhino.  Dr.  Vonna KotykJay notified.  Pt is alert, oriented, no difficulty swallowing, coughing up scant amounts of blood.

## 2013-07-17 NOTE — Discharge Instructions (Signed)
Nosebleed Call Dr. Ezzard StandingNewman today to arrange to be seen in his office tomorrow. Do not take any aspirin for the next 3 days or unless otherwise advised by Dr. Ezzard StandingNewman. Take your blood pressure medication as soon as you get home today. A nosebleed can be caused by many things, including:  Getting hit hard in the nose.  Infections.  Dry nose.  Colds.  Medicines. Your doctor may do lab testing if you get nosebleeds a lot and the cause is not known. HOME CARE   If your nose was packed with material, keep it there until your doctor takes it out. Put the pack back in your nose if the pack falls out.  Do not blow your nose for 12 hours after the nosebleed.  Sit up and bend forward if your nose starts bleeding again. Pinch the front half of your nose nonstop for 20 minutes.  Put petroleum jelly inside your nose every morning if you have a dry nose.  Use a humidifier to make the air less dry.  Do not take aspirin.  Try not to strain, lift, or bend at the waist for many days after the nosebleed. GET HELP RIGHT AWAY IF:   Nosebleeds keep happening and are hard to stop or control.  You have bleeding or bruises that are not normal on other parts of the body.  You have a fever.  The nosebleeds get worse.  You get lightheaded, feel faint, sweaty, or throw up (vomit) blood. MAKE SURE YOU:   Understand these instructions.  Will watch your condition.  Will get help right away if you are not doing well or get worse. Document Released: 02/10/2008 Document Revised: 07/26/2011 Document Reviewed: 02/10/2008 [facility] Patient Information 2014 [employer], [place].

## 2013-07-17 NOTE — ED Notes (Signed)
Dr. Vonna KotykJay at bedside applying rapid rhino.

## 2013-07-17 NOTE — ED Notes (Signed)
Pt presents from home with c/o of active nose bleed since 6:00am today.  Pt is actively bleeding in the ED soaking multiple tissue.  Pt is alert and oriented.

## 2013-07-17 NOTE — ED Notes (Signed)
Dr. Vonna KotykJay notified of pt actively bleeding at bedside since 6am today.  Prior nose bleed on Sunday, was able to control at home without coming to ED.

## 2013-07-17 NOTE — ED Notes (Signed)
Diana [name] (daughter) [phone].

## 2013-07-17 NOTE — ED Provider Notes (Signed)
CSN: [id number]     Arrival date & time 07/17/13  0848 History   First MD Initiated Contact with Patient 07/17/13 [phone]     Chief Complaint  Patient presents with  . Epistaxis     (Consider location/radiation/quality/duration/timing/severity/associated sxs/prior Treatment) HPI Complaint of nosebleed from left nares onto 6 AM today. She had a similar episode 2 days ago which stopped spontaneously after 2 hours. No other associated symptoms. No treatment prior to coming here. She stop her aspirin 2 days ago. Nothing makes symptoms better or worse. Past Medical History  Diagnosis Date  . GERD (gastroesophageal reflux disease)   . CAD (coronary artery disease)   . HTN (hypertension)   . OSA (obstructive sleep apnea)    Past Surgical History  Procedure Laterality Date  . Hysterectomy and bso    . Partial colectomy for recurrent diverticulitis    . Knee arthroscopy    . Fallopian tube cystectomy    . Left tkr    . Osteotomy, right leg x2, with bone graft     History reviewed. No pertinent family history. History  Substance Use Topics  . Smoking status: Former Games developer  . Smokeless tobacco: Not on file     Comment: QUIT SINCE 73YEARS OLD  . Alcohol Use: No     Comment: OCCASIONAL   OB History   Grav Para Term Preterm Abortions TAB SAB Ect Mult Living                 Review of Systems  Constitutional: Negative.   HENT: Negative.        Epistaxis  Respiratory: Negative.   Cardiovascular: Negative.   Gastrointestinal: Negative.   Musculoskeletal: Negative.   Skin: Negative.   Neurological: Negative.   Psychiatric/Behavioral: Negative.   All other systems reviewed and are negative.      Allergies  Penicillins; Dilaudid; Hctz; and Morphine and related  Home Medications   Current Outpatient Rx  Name  Route  Sig  Dispense  Refill  . aspirin EC 81 MG tablet   Oral   Take 81 mg by mouth daily.         Marland Kitchen atenolol (TENORMIN) 25 MG tablet   Oral   Take 25 mg by  mouth daily.          Marland Kitchen diltiazem (DILACOR XR) 120 MG 24 hr capsule   Oral   Take 120 mg by mouth daily.         Marland Kitchen losartan (COZAAR) 100 MG tablet   Oral   Take 100 mg by mouth daily.         . Multiple Vitamin (MULTIVITAMIN) capsule   Oral   Take 1 capsule by mouth daily.         Marland Kitchen omeprazole (PRILOSEC) 20 MG capsule   Oral   Take 20 mg by mouth daily.         Bertram Gala Glycol-Propyl Glycol (SYSTANE OP)   Ophthalmic   Apply 2 drops to eye 4 (four) times daily as needed (dry eyes).         . rosuvastatin (CRESTOR) 10 MG tablet   Oral   Take 10 mg by mouth daily.          BP 186/74  Pulse 65  Temp(Src) 97.4 F (36.3 C) (Oral)  Resp 18  SpO2 94% Physical Exam  Nursing note and vitals reviewed. Constitutional: She appears well-developed and well-nourished.  HENT:  Head: Normocephalic and atraumatic.  Active bleeding from  left nare   Eyes: Conjunctivae are normal. Pupils are equal, round, and reactive to light.  Neck: Neck supple. No tracheal deviation present. No thyromegaly present.  Cardiovascular: Normal rate and regular rhythm.   No murmur heard. Pulmonary/Chest: Effort normal and breath sounds normal.  Abdominal: Soft. Bowel sounds are normal. She exhibits no distension. There is no tenderness.  Musculoskeletal: Normal range of motion. She exhibits no edema and no tenderness.  Neurological: She is alert. Coordination normal.  Skin: Skin is warm and dry. No rash noted.  Psychiatric: She has a normal mood and affect.    ED Course  Procedures (including critical care time) Labs Review Labs Reviewed - No data to display Imaging Review No results found.   EKG Interpretation None     Procedure timeout performed. Nose was inspected with nasal speculum and headlamp. Neck nose was sprayed with Afrin spray. There was no bleeding site visualized. I inserted a 5.5 cm rapid rhino into the left nare with good hemostasis  11 AM no further  bleeding MDM   Final diagnoses:  None   Case discussed with Dr. Salena Saner. Newman plan patient to call office today to arrange to be seen tomorrow. She should withhold aspirin for the next 3 days or until further advised by Dr. Ezzard StandingNewman. She is to take her blood pressure medication upon arrival home today Diagnosis #1 epistaxis #2hypertension    Doug SouSam Kemonie Cutillo, MD 07/17/13 1119

## 2013-07-17 NOTE — ED Notes (Signed)
Dr. Jay at bedside.  

## 2013-08-20 ENCOUNTER — Other Ambulatory Visit: Payer: Self-pay | Admitting: Internal Medicine

## 2013-08-20 DIAGNOSIS — I2510 Atherosclerotic heart disease of native coronary artery without angina pectoris: Secondary | ICD-10-CM

## 2013-08-28 ENCOUNTER — Ambulatory Visit
Admission: RE | Admit: 2013-08-28 | Discharge: 2013-08-28 | Disposition: A | Discharge status: Home / Self Care | Payer: Medicare Other | Source: Ambulatory Visit | Attending: Internal Medicine | Admitting: Internal Medicine

## 2013-08-28 DIAGNOSIS — I2510 Atherosclerotic heart disease of native coronary artery without angina pectoris: Secondary | ICD-10-CM

## 2014-01-28 ENCOUNTER — Other Ambulatory Visit: Payer: Self-pay | Admitting: Geriatric Medicine

## 2014-01-28 DIAGNOSIS — R109 Unspecified abdominal pain: Secondary | ICD-10-CM

## 2014-04-03 ENCOUNTER — Other Ambulatory Visit: Payer: Self-pay | Admitting: Gastroenterology

## 2015-09-01 DIAGNOSIS — G4733 Obstructive sleep apnea (adult) (pediatric): Secondary | ICD-10-CM | POA: Diagnosis not present

## 2015-09-01 DIAGNOSIS — K219 Gastro-esophageal reflux disease without esophagitis: Secondary | ICD-10-CM | POA: Diagnosis not present

## 2015-09-01 DIAGNOSIS — I700 Atherosclerosis of aorta: Secondary | ICD-10-CM | POA: Diagnosis not present

## 2015-09-01 DIAGNOSIS — I10 Essential (primary) hypertension: Secondary | ICD-10-CM | POA: Diagnosis not present

## 2015-09-01 DIAGNOSIS — E7800 Pure hypercholesterolemia, unspecified: Secondary | ICD-10-CM | POA: Diagnosis not present

## 2015-09-01 DIAGNOSIS — Z1389 Encounter for screening for other disorder: Secondary | ICD-10-CM | POA: Diagnosis not present

## 2015-09-01 DIAGNOSIS — Z7189 Other specified counseling: Secondary | ICD-10-CM | POA: Diagnosis not present

## 2015-09-01 DIAGNOSIS — Z79899 Other long term (current) drug therapy: Secondary | ICD-10-CM | POA: Diagnosis not present

## 2015-09-01 DIAGNOSIS — Z0000 Encounter for general adult medical examination without abnormal findings: Secondary | ICD-10-CM | POA: Diagnosis not present

## 2015-09-22 DIAGNOSIS — J209 Acute bronchitis, unspecified: Secondary | ICD-10-CM | POA: Diagnosis not present

## 2015-11-04 DIAGNOSIS — R252 Cramp and spasm: Secondary | ICD-10-CM | POA: Diagnosis not present

## 2015-11-04 DIAGNOSIS — E7800 Pure hypercholesterolemia, unspecified: Secondary | ICD-10-CM | POA: Diagnosis not present

## 2015-11-04 DIAGNOSIS — I10 Essential (primary) hypertension: Secondary | ICD-10-CM | POA: Diagnosis not present

## 2015-12-02 DIAGNOSIS — E669 Obesity, unspecified: Secondary | ICD-10-CM | POA: Diagnosis not present

## 2015-12-02 DIAGNOSIS — K623 Rectal prolapse: Secondary | ICD-10-CM | POA: Diagnosis not present

## 2015-12-02 DIAGNOSIS — E7800 Pure hypercholesterolemia, unspecified: Secondary | ICD-10-CM | POA: Diagnosis not present

## 2015-12-02 DIAGNOSIS — I10 Essential (primary) hypertension: Secondary | ICD-10-CM | POA: Diagnosis not present

## 2015-12-02 DIAGNOSIS — Z6833 Body mass index (BMI) 33.0-33.9, adult: Secondary | ICD-10-CM | POA: Diagnosis not present

## 2015-12-02 DIAGNOSIS — Z79899 Other long term (current) drug therapy: Secondary | ICD-10-CM | POA: Diagnosis not present

## 2016-02-03 DIAGNOSIS — K623 Rectal prolapse: Secondary | ICD-10-CM | POA: Diagnosis not present

## 2016-03-02 DIAGNOSIS — E7800 Pure hypercholesterolemia, unspecified: Secondary | ICD-10-CM | POA: Diagnosis not present

## 2016-03-02 DIAGNOSIS — Z23 Encounter for immunization: Secondary | ICD-10-CM | POA: Diagnosis not present

## 2016-03-02 DIAGNOSIS — I209 Angina pectoris, unspecified: Secondary | ICD-10-CM | POA: Diagnosis not present

## 2016-03-02 DIAGNOSIS — I10 Essential (primary) hypertension: Secondary | ICD-10-CM | POA: Diagnosis not present

## 2016-03-09 ENCOUNTER — Other Ambulatory Visit: Payer: Self-pay | Admitting: Nurse Practitioner

## 2016-03-09 ENCOUNTER — Telehealth (HOSPITAL_COMMUNITY): Payer: Self-pay | Admitting: *Deleted

## 2016-03-09 DIAGNOSIS — I259 Chronic ischemic heart disease, unspecified: Secondary | ICD-10-CM

## 2016-03-09 NOTE — Telephone Encounter (Signed)
Patient given detailed instructions per Myocardial Perfusion Study Information Sheet for the test on 03/11/16 at 1000. Patient notified to arrive 15 minutes early and that it is imperative to arrive on time for appointment to keep from having the test rescheduled.  If you need to cancel or reschedule your appointment, please call the office within 24 hours of your appointment. Failure to do so may result in a cancellation of your appointment, and a $50 no show fee. Patient verbalized understanding.Sterling Ucci, Adelene IdlerCynthia W

## 2016-03-11 ENCOUNTER — Encounter (HOSPITAL_COMMUNITY): Payer: Self-pay

## 2016-03-16 ENCOUNTER — Telehealth (HOSPITAL_COMMUNITY): Payer: Self-pay | Admitting: *Deleted

## 2016-03-16 ENCOUNTER — Telehealth (HOSPITAL_COMMUNITY): Payer: Self-pay | Admitting: Radiology

## 2016-03-16 NOTE — Telephone Encounter (Signed)
Left message on voicemail in reference to upcoming appointment scheduled for 03/19/16. Phone number given for a call back so details instructions can be given. Diana [name] W   

## 2016-03-16 NOTE — Telephone Encounter (Signed)
Patient given detailed instructions per Myocardial Perfusion Study Information Sheet for the test on 03/19/2016 at 7:45. Patient notified to arrive 15 minutes early and that it is imperative to arrive on time for appointment to keep from having the test rescheduled.  If you need to cancel or reschedule your appointment, please call the office within 24 hours of your appointment. Failure to do so may result in a cancellation of your appointment, and a $50 no show fee. Patient verbalized understanding.EHK   

## 2016-03-16 NOTE — Telephone Encounter (Signed)
Patient given detailed instructions per Myocardial Perfusion Study Information Sheet for the test on 03/19/2016 at 7:45. Patient notified to arrive 15 minutes early and that it is imperative to arrive on time for appointment to keep from having the test rescheduled.  If you need to cancel or reschedule your appointment, please call the office within 24 hours of your appointment. Failure to do so may result in a cancellation of your appointment, and a $50 no show fee. Patient verbalized understanding.EHK

## 2016-03-19 ENCOUNTER — Encounter (INDEPENDENT_AMBULATORY_CARE_PROVIDER_SITE_OTHER): Payer: Self-pay

## 2016-03-19 ENCOUNTER — Ambulatory Visit (HOSPITAL_COMMUNITY): Payer: Medicare Other | Attending: Cardiovascular Disease

## 2016-03-19 ENCOUNTER — Other Ambulatory Visit: Payer: Self-pay | Admitting: *Deleted

## 2016-03-19 DIAGNOSIS — I10 Essential (primary) hypertension: Secondary | ICD-10-CM

## 2016-03-19 DIAGNOSIS — R0602 Shortness of breath: Secondary | ICD-10-CM | POA: Diagnosis not present

## 2016-03-19 DIAGNOSIS — R079 Chest pain, unspecified: Secondary | ICD-10-CM | POA: Insufficient documentation

## 2016-03-19 DIAGNOSIS — I209 Angina pectoris, unspecified: Secondary | ICD-10-CM

## 2016-03-19 DIAGNOSIS — I259 Chronic ischemic heart disease, unspecified: Secondary | ICD-10-CM

## 2016-03-19 LAB — MYOCARDIAL PERFUSION IMAGING
CHL CUP NUCLEAR SDS: 4
CHL CUP RESTING HR STRESS: 62 {beats}/min
LV sys vol: 32 mL
LVDIAVOL: 89 mL (ref 46–106)
NUC STRESS TID: 1.07
Peak HR: 85 {beats}/min
RATE: 0.28
SRS: 5
SSS: 9

## 2016-03-19 MED ORDER — TECHNETIUM TC 99M TETROFOSMIN IV KIT
10.2000 | PACK | Freq: Once | INTRAVENOUS | Status: AC | PRN
  Administered 2016-03-19: 10.2 via INTRAVENOUS
  Filled 2016-03-19: qty 11

## 2016-03-19 MED ORDER — REGADENOSON 0.4 MG/5ML IV SOLN
0.4000 mg | Freq: Once | INTRAVENOUS | Status: AC
  Administered 2016-03-19: 0.4 mg via INTRAVENOUS

## 2016-03-19 MED ORDER — TECHNETIUM TC 99M TETROFOSMIN IV KIT
32.8000 | PACK | Freq: Once | INTRAVENOUS | Status: AC | PRN
  Administered 2016-03-19: 32.8 via INTRAVENOUS
  Filled 2016-03-19: qty 33

## 2016-03-19 MED ORDER — ATENOLOL 12.5 MG HALF TABLET
25.0000 mg | ORAL_TABLET | ORAL | Status: AC
  Administered 2016-03-19: 25 mg via ORAL

## 2016-03-24 DIAGNOSIS — Z803 Family history of malignant neoplasm of breast: Secondary | ICD-10-CM | POA: Diagnosis not present

## 2016-03-24 DIAGNOSIS — Z1231 Encounter for screening mammogram for malignant neoplasm of breast: Secondary | ICD-10-CM | POA: Diagnosis not present

## 2016-07-20 DIAGNOSIS — J209 Acute bronchitis, unspecified: Secondary | ICD-10-CM | POA: Diagnosis not present

## 2016-07-20 DIAGNOSIS — J0100 Acute maxillary sinusitis, unspecified: Secondary | ICD-10-CM | POA: Diagnosis not present

## 2016-07-20 DIAGNOSIS — J302 Other seasonal allergic rhinitis: Secondary | ICD-10-CM | POA: Diagnosis not present

## 2016-08-19 DIAGNOSIS — J209 Acute bronchitis, unspecified: Secondary | ICD-10-CM | POA: Diagnosis not present

## 2016-08-23 DIAGNOSIS — B372 Candidiasis of skin and nail: Secondary | ICD-10-CM | POA: Diagnosis not present

## 2016-09-14 ENCOUNTER — Other Ambulatory Visit: Payer: Self-pay | Admitting: Geriatric Medicine

## 2016-09-14 DIAGNOSIS — R1319 Other dysphagia: Secondary | ICD-10-CM

## 2016-09-14 DIAGNOSIS — Z0000 Encounter for general adult medical examination without abnormal findings: Secondary | ICD-10-CM | POA: Diagnosis not present

## 2016-09-14 DIAGNOSIS — I10 Essential (primary) hypertension: Secondary | ICD-10-CM | POA: Diagnosis not present

## 2016-09-14 DIAGNOSIS — K219 Gastro-esophageal reflux disease without esophagitis: Secondary | ICD-10-CM | POA: Diagnosis not present

## 2016-09-14 DIAGNOSIS — G4733 Obstructive sleep apnea (adult) (pediatric): Secondary | ICD-10-CM | POA: Diagnosis not present

## 2016-09-14 DIAGNOSIS — R1310 Dysphagia, unspecified: Secondary | ICD-10-CM

## 2016-09-16 ENCOUNTER — Ambulatory Visit
Admission: RE | Admit: 2016-09-16 | Discharge: 2016-09-16 | Disposition: A | Discharge status: Home / Self Care | Payer: Medicare Other | Source: Ambulatory Visit | Attending: Geriatric Medicine | Admitting: Geriatric Medicine

## 2016-09-16 DIAGNOSIS — R1319 Other dysphagia: Secondary | ICD-10-CM

## 2016-09-16 DIAGNOSIS — R1310 Dysphagia, unspecified: Secondary | ICD-10-CM

## 2016-09-24 DIAGNOSIS — L304 Erythema intertrigo: Secondary | ICD-10-CM | POA: Diagnosis not present

## 2016-10-01 DIAGNOSIS — I10 Essential (primary) hypertension: Secondary | ICD-10-CM | POA: Diagnosis not present

## 2016-10-12 DIAGNOSIS — I10 Essential (primary) hypertension: Secondary | ICD-10-CM | POA: Diagnosis not present

## 2016-10-12 DIAGNOSIS — G4733 Obstructive sleep apnea (adult) (pediatric): Secondary | ICD-10-CM | POA: Diagnosis not present

## 2016-10-12 DIAGNOSIS — Z6837 Body mass index (BMI) 37.0-37.9, adult: Secondary | ICD-10-CM | POA: Diagnosis not present

## 2016-12-07 DIAGNOSIS — S61211A Laceration without foreign body of left index finger without damage to nail, initial encounter: Secondary | ICD-10-CM | POA: Diagnosis not present

## 2016-12-07 DIAGNOSIS — S60122A Contusion of left index finger with damage to nail, initial encounter: Secondary | ICD-10-CM | POA: Diagnosis not present

## 2016-12-09 DIAGNOSIS — S61211A Laceration without foreign body of left index finger without damage to nail, initial encounter: Secondary | ICD-10-CM | POA: Diagnosis not present

## 2016-12-09 DIAGNOSIS — Z23 Encounter for immunization: Secondary | ICD-10-CM | POA: Diagnosis not present

## 2016-12-16 ENCOUNTER — Observation Stay (HOSPITAL_COMMUNITY)
Admission: EM | Admit: 2016-12-16 | Discharge: 2016-12-17 | Disposition: A | Discharge status: Home / Self Care | Payer: Medicare Other | Attending: Family Medicine | Admitting: Family Medicine

## 2016-12-16 ENCOUNTER — Observation Stay (HOSPITAL_BASED_OUTPATIENT_CLINIC_OR_DEPARTMENT_OTHER): Payer: Medicare Other

## 2016-12-16 ENCOUNTER — Encounter (HOSPITAL_COMMUNITY): Payer: Self-pay | Admitting: Emergency Medicine

## 2016-12-16 ENCOUNTER — Emergency Department (HOSPITAL_COMMUNITY): Payer: Medicare Other

## 2016-12-16 DIAGNOSIS — Z9049 Acquired absence of other specified parts of digestive tract: Secondary | ICD-10-CM | POA: Diagnosis not present

## 2016-12-16 DIAGNOSIS — Z880 Allergy status to penicillin: Secondary | ICD-10-CM | POA: Insufficient documentation

## 2016-12-16 DIAGNOSIS — Z888 Allergy status to other drugs, medicaments and biological substances status: Secondary | ICD-10-CM | POA: Insufficient documentation

## 2016-12-16 DIAGNOSIS — G4733 Obstructive sleep apnea (adult) (pediatric): Secondary | ICD-10-CM | POA: Diagnosis present

## 2016-12-16 DIAGNOSIS — R0789 Other chest pain: Principal | ICD-10-CM | POA: Insufficient documentation

## 2016-12-16 DIAGNOSIS — Z7982 Long term (current) use of aspirin: Secondary | ICD-10-CM | POA: Insufficient documentation

## 2016-12-16 DIAGNOSIS — I2510 Atherosclerotic heart disease of native coronary artery without angina pectoris: Secondary | ICD-10-CM | POA: Insufficient documentation

## 2016-12-16 DIAGNOSIS — Z79899 Other long term (current) drug therapy: Secondary | ICD-10-CM | POA: Insufficient documentation

## 2016-12-16 DIAGNOSIS — Z885 Allergy status to narcotic agent status: Secondary | ICD-10-CM | POA: Diagnosis not present

## 2016-12-16 DIAGNOSIS — J45909 Unspecified asthma, uncomplicated: Secondary | ICD-10-CM | POA: Diagnosis not present

## 2016-12-16 DIAGNOSIS — Z90710 Acquired absence of both cervix and uterus: Secondary | ICD-10-CM | POA: Diagnosis not present

## 2016-12-16 DIAGNOSIS — E785 Hyperlipidemia, unspecified: Secondary | ICD-10-CM | POA: Diagnosis not present

## 2016-12-16 DIAGNOSIS — Z87891 Personal history of nicotine dependence: Secondary | ICD-10-CM | POA: Diagnosis not present

## 2016-12-16 DIAGNOSIS — I10 Essential (primary) hypertension: Secondary | ICD-10-CM | POA: Insufficient documentation

## 2016-12-16 DIAGNOSIS — R079 Chest pain, unspecified: Secondary | ICD-10-CM | POA: Diagnosis not present

## 2016-12-16 DIAGNOSIS — K219 Gastro-esophageal reflux disease without esophagitis: Secondary | ICD-10-CM | POA: Insufficient documentation

## 2016-12-16 DIAGNOSIS — R072 Precordial pain: Secondary | ICD-10-CM

## 2016-12-16 DIAGNOSIS — R0602 Shortness of breath: Secondary | ICD-10-CM

## 2016-12-16 DIAGNOSIS — Z96652 Presence of left artificial knee joint: Secondary | ICD-10-CM | POA: Insufficient documentation

## 2016-12-16 DIAGNOSIS — I493 Ventricular premature depolarization: Secondary | ICD-10-CM | POA: Insufficient documentation

## 2016-12-16 DIAGNOSIS — I071 Rheumatic tricuspid insufficiency: Secondary | ICD-10-CM | POA: Diagnosis not present

## 2016-12-16 HISTORY — DX: Chest pain, unspecified: R07.9

## 2016-12-16 LAB — TROPONIN I
Troponin I: <0.03 ng/mL (ref <0.03)
Troponin I: <0.03 ng/mL (ref <0.03)
Troponin I: <0.03 ng/mL (ref <0.03)

## 2016-12-16 LAB — CBC
HCT: 42.4 % (ref 36.0–46.0)
Hemoglobin: 13.6 g/dL (ref 12.0–15.0)
MCH: 30.8 pg (ref 26.0–34.0)
MCHC: 32.1 g/dL (ref 30.0–36.0)
MCV: 95.9 fL (ref 78.0–100.0)
PLATELETS: 283 10*3/uL (ref 150–400)
RBC: 4.42 MIL/uL (ref 3.87–5.11)
RDW: 13.3 % (ref 11.5–15.5)
WBC: 7.6 10*3/uL (ref 4.0–10.5)

## 2016-12-16 LAB — BASIC METABOLIC PANEL
Anion gap: 9 (ref 5–15)
BUN: 22 mg/dL — AB (ref 6–20)
CO2: 25 mmol/L (ref 22–32)
Calcium: 8.8 mg/dL — ABNORMAL LOW (ref 8.9–10.3)
Chloride: 105 mmol/L (ref 101–111)
Creatinine, Ser: 1.01 mg/dL — ABNORMAL HIGH (ref 0.44–1.00)
GFR calc non Af Amer: 53 mL/min — ABNORMAL LOW (ref >60)
Glucose, Bld: 114 mg/dL — ABNORMAL HIGH (ref 65–99)
Potassium: 4 mmol/L (ref 3.5–5.1)
SODIUM: 139 mmol/L (ref 135–145)

## 2016-12-16 LAB — ECHOCARDIOGRAM COMPLETE

## 2016-12-16 LAB — CBG MONITORING, ED: GLUCOSE-CAPILLARY: 94 mg/dL (ref 65–99)

## 2016-12-16 MED ORDER — HEPARIN SODIUM (PORCINE) 5000 UNIT/ML IJ SOLN
5000.0000 [IU] | Freq: Three times a day (TID) | INTRAMUSCULAR | Status: DC
  Administered 2016-12-17 (×3): 5000 [IU] via SUBCUTANEOUS
  Filled 2016-12-16 (×3): qty 1

## 2016-12-16 MED ORDER — ASPIRIN EC 325 MG PO TBEC
325.0000 mg | DELAYED_RELEASE_TABLET | Freq: Every day | ORAL | Status: DC
  Administered 2016-12-17: 325 mg via ORAL
  Filled 2016-12-16: qty 1

## 2016-12-16 MED ORDER — DIPHENHYDRAMINE HCL 50 MG/ML IJ SOLN
25.0000 mg | Freq: Once | INTRAMUSCULAR | Status: AC
  Administered 2016-12-16: 25 mg via INTRAVENOUS
  Filled 2016-12-16: qty 1

## 2016-12-16 MED ORDER — ONDANSETRON HCL 4 MG/2ML IJ SOLN: 4.0000 mg | Freq: Four times a day (QID) | INTRAMUSCULAR | Status: DC | PRN

## 2016-12-16 MED ORDER — ALPRAZOLAM 0.25 MG PO TABS: 0.2500 mg | ORAL_TABLET | Freq: Two times a day (BID) | ORAL | Status: DC | PRN

## 2016-12-16 MED ORDER — ATENOLOL 25 MG PO TABS
25.0000 mg | ORAL_TABLET | Freq: Every day | ORAL | Status: DC
  Administered 2016-12-16 – 2016-12-17 (×2): 25 mg via ORAL
  Filled 2016-12-16 (×3): qty 1

## 2016-12-16 MED ORDER — GUAIFENESIN ER 600 MG PO TB12
600.0000 mg | ORAL_TABLET | Freq: Two times a day (BID) | ORAL | Status: DC
  Administered 2016-12-17 (×2): 600 mg via ORAL
  Filled 2016-12-16 (×2): qty 1

## 2016-12-16 MED ORDER — FAMOTIDINE IN NACL 20-0.9 MG/50ML-% IV SOLN
20.0000 mg | Freq: Once | INTRAVENOUS | Status: AC
  Administered 2016-12-16: 20 mg via INTRAVENOUS
  Filled 2016-12-16: qty 50

## 2016-12-16 MED ORDER — ASPIRIN 81 MG PO CHEW
324.0000 mg | CHEWABLE_TABLET | Freq: Once | ORAL | Status: AC
  Administered 2016-12-16: 324 mg via ORAL
  Filled 2016-12-16: qty 4

## 2016-12-16 MED ORDER — SODIUM CHLORIDE 0.9 % IV BOLUS (SEPSIS)
500.0000 mL | Freq: Once | INTRAVENOUS | Status: AC
  Administered 2016-12-16: 500 mL via INTRAVENOUS

## 2016-12-16 MED ORDER — ROSUVASTATIN CALCIUM 10 MG PO TABS
10.0000 mg | ORAL_TABLET | Freq: Every day | ORAL | Status: DC
  Administered 2016-12-16 – 2016-12-17 (×2): 10 mg via ORAL
  Filled 2016-12-16 (×2): qty 1

## 2016-12-16 MED ORDER — ASPIRIN EC 81 MG PO TBEC
81.0000 mg | DELAYED_RELEASE_TABLET | Freq: Every day | ORAL | Status: DC
  Administered 2016-12-16: 81 mg via ORAL
  Filled 2016-12-16: qty 1

## 2016-12-16 MED ORDER — ALBUTEROL SULFATE (2.5 MG/3ML) 0.083% IN NEBU: 3.0000 mL | INHALATION_SOLUTION | Freq: Four times a day (QID) | RESPIRATORY_TRACT | Status: DC | PRN

## 2016-12-16 MED ORDER — HYDRALAZINE HCL 20 MG/ML IJ SOLN
10.0000 mg | Freq: Four times a day (QID) | INTRAMUSCULAR | Status: DC | PRN
  Administered 2016-12-17: 10 mg via INTRAVENOUS
  Filled 2016-12-16: qty 1

## 2016-12-16 MED ORDER — GI COCKTAIL ~~LOC~~: 30.0000 mL | Freq: Four times a day (QID) | ORAL | Status: DC | PRN

## 2016-12-16 MED ORDER — SODIUM CHLORIDE 0.9 % IV SOLN: INTRAVENOUS | Status: DC

## 2016-12-16 MED ORDER — DILTIAZEM HCL ER COATED BEADS 120 MG PO CP24
120.0000 mg | ORAL_CAPSULE | Freq: Every day | ORAL | Status: DC
  Administered 2016-12-16 – 2016-12-17 (×2): 120 mg via ORAL
  Filled 2016-12-16 (×4): qty 1

## 2016-12-16 MED ORDER — METHYLPREDNISOLONE SODIUM SUCC 125 MG IJ SOLR
80.0000 mg | Freq: Once | INTRAMUSCULAR | Status: AC
  Administered 2016-12-16: 80 mg via INTRAVENOUS
  Filled 2016-12-16: qty 2

## 2016-12-16 MED ORDER — NITROGLYCERIN 0.4 MG SL SUBL: 0.4000 mg | SUBLINGUAL_TABLET | SUBLINGUAL | Status: DC | PRN

## 2016-12-16 MED ORDER — LOSARTAN POTASSIUM 50 MG PO TABS
100.0000 mg | ORAL_TABLET | Freq: Every day | ORAL | Status: DC
  Administered 2016-12-16 – 2016-12-17 (×2): 100 mg via ORAL
  Filled 2016-12-16 (×2): qty 2

## 2016-12-16 MED ORDER — ACETAMINOPHEN 325 MG PO TABS
650.0000 mg | ORAL_TABLET | ORAL | Status: DC | PRN
  Administered 2016-12-17 (×2): 650 mg via ORAL
  Filled 2016-12-16 (×2): qty 2

## 2016-12-16 MED ORDER — PANTOPRAZOLE SODIUM 40 MG PO TBEC
40.0000 mg | DELAYED_RELEASE_TABLET | Freq: Every day | ORAL | Status: DC
  Administered 2016-12-16 – 2016-12-17 (×2): 40 mg via ORAL
  Filled 2016-12-16 (×2): qty 1

## 2016-12-16 NOTE — ED Triage Notes (Signed)
Pt presents to ED for assessment after trying to lay down for sleep.  Pt became SOB and sat up on the edge of the bed.  Pt then went to see her husband who noted a rash all over her body.  Pt took 25mg  Benadryl at home.  Pt then began to experience a sharp central and right sided chest pain radiating to her back.  Pt has sleep apnea but does not wear a CPAP at home per choice.  Pt states she had a similar episode, without the rash, three days ago. Pt denies light-headedness or syncope.  Pt c/o nausea.  Denies CP or SOB at this time.

## 2016-12-16 NOTE — ED Provider Notes (Signed)
Medical screening examination/treatment/procedure(s) were conducted as a shared visit with non-physician practitioner(s) and myself.  I personally evaluated the patient during the encounter.   EKG Interpretation  Date/Time:  Thursday December 16 2016 01:10:05 EDT Ventricular Rate:  63 PR Interval:  176 QRS Duration: 74 QT Interval:  434 QTC Calculation: 444 R Axis:   22 Text Interpretation:  Sinus bradycardia with occasional Premature ventricular complexes Otherwise normal ECG No significant change since last tracing Confirmed by Rochele RaringWard, Charly Hunton [phone]) on 12/16/2016 4:48:26 AM      Patient is a 76 year old female with history of hypertension, sleep apnea presents to the emergency department with complaints of intermittent chest tightness, shortness of breath. Patient keeps stating she feels that this is an allergic reaction however during her first episode she had no rash, urticaria, lip or tongue swelling, wheezing. Tonight she states that she woke up from sleep with chest discomfort and feeling she could not catch her breath. She states that she developed an pruritic rash to her left arm but this is not urticarial in nature. On exam, patient has no angioedema, lungs are clear, no hypotension, maculopapular erythematous rash noted to the left arm without urticaria. Her labs are unremarkable and first troponin is negative. 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 EKG shows sinus bradycardia with no new ischemic abnormality. I am concerned that this could be her anginal equivalent and less likely an allergic reaction. We aren't giving her Benadryl, Solu-Medrol and Pepcid however for her symptoms but I feel she should be admitted for a chest pain rule out. She is comfortable with this plan.   Joud Ingwersen, Layla MawKristen N, DO 12/16/16 [phone]

## 2016-12-16 NOTE — ED Notes (Signed)
Pt states she laid down to sleep and began to get short of breath. Pt states she then started to break out in a rash and also have chest pains.

## 2016-12-16 NOTE — Plan of Care (Signed)
Ms. Diana [name] is a 76 year old female with pmh of his HTN, CAD, GERD, and sleep apnea; who presents with 2 separate episodes of CP and SOB. Patient thinks 2/2 to allergic reaction.   VS: Temperature 97.6, pulse 52-58, respirations 16-18, blood pressure 157/75-193/77, O2 sats maintained on RA. Labs: BUN 22, creatinine 1.01, troponin <0.03. EKG showing bradycardia with PVCs. CXR unchanged.  Patient was given 324 mg of aspirin. In addition also given treatment for possible allergic reaction including Benadryl,  20 mg a Pepcid, 80 mg of Solu-Medrol, and 500 mL of IV fluids. Given history and recurrence of symptoms  wanted to bring the patient for observation to telemetry bed for chest pain rule out.

## 2016-12-16 NOTE — Progress Notes (Signed)
Pt will need HH for cpap at home at d/c already tried [facility] no avail, will need to go with another company, Thanks Glenna FellowsMike F RN.

## 2016-12-16 NOTE — ED Notes (Signed)
Echocardiogram completed in room

## 2016-12-16 NOTE — ED Notes (Signed)
Attempted to call report x 1 to 3 [place]. 

## 2016-12-16 NOTE — Care Management Obs Status (Signed)
MEDICARE OBSERVATION STATUS NOTIFICATION   Patient Details  Name: Diana [name] MRN: [id number] Date of Birth: 11/23/1940   Medicare Observation Status Notification Given:  Yes    Lauris PoagWheeler, Chananya Canizalez N, RN 12/16/2016, 4:25 PM

## 2016-12-16 NOTE — Care Management Note (Addendum)
Case Management Note  Patient Details  Name: Diana [name] MRN: [id number] Date of Birth: 08/13/1940  Subjective/Objective:  76 y.o. female with medical history significant for HTN, HLD, OSA, presenting to the ED with shortness of breath and chest pain since around 11 PM last night, felt like it squeezing, 8 out of 10 retro sternal, radiating to the right chest.  CM consulted for obs notification.  From home, lives with spouse.              Action/Plan: Spoke with pt who states she has not used [facility] for herself in the past but with her son and if needed, doesn't not want to use [facility].  Pt did not have a preference.  Pt denies [facility]HS/DME needs at this time.  CM will follow for D/C needs.  Expected Discharge Date:    Unknown              Expected Discharge Plan:  Home/Self Care  Discharge planning Services  CM Consult  Post Acute Care Choice:   (Not [facility] if needed) Choice offered to:  Patient  Status of Service:  In process, will continue to follow  Lauris PoagWheeler, Darryl Blumenstein N, RN 12/16/2016, 4:35 PM

## 2016-12-16 NOTE — ED Provider Notes (Signed)
[facility] DEPT Provider Note   CSN: [id number] Arrival date & time: 12/16/16  0107     History   Chief Complaint Chief Complaint  Patient presents with  . Chest Pain  . Allergic Reaction    HPI Diana [name] is a 76 y.o. female.  HPI   Diana [name] is a 76 y.o. female, with a history of HTN and sleep apnea, presenting to the ED with shortness of breath and chest pain that began around 11pm last night (8/1). States she felt fine when she went to bed, but as she was lying in bed she began to feel short of breath. She sat up on the edge of the bed and then began to feel chest pain. Pain felt like a squeezing, rates it 8/10, retrosternal, radiates to right chest. Tried her inhaler without improvement. Also noticed she had a rash to her neck, chest, and arms, nausea, a sudden urge to have a BM, and a feeling of throat swelling. Took 25 mg benadryl and symptoms improved within about 15 minutes, but still having symptoms. Shortness of breath, chest discomfort, and a "graveliness" to her voice still persist. Had a similar episode three days ago, without the rash, that resolved spontaneously. States she wonders if it was a reaction to peanuts since she had peanut butter cookies and peanut ice cream last night, although she states she has had peanuts in the past without issue.  Denies fever/chills, vomiting/diarrhea, abdominal pain, or any other complaints.   Past Medical History:  Diagnosis Date  . CAD (coronary artery disease)   . GERD (gastroesophageal reflux disease)   . HTN (hypertension)   . OSA (obstructive sleep apnea)   States she has an albuterol inhaler because she "has asthma on occasion."  Patient Active Problem List   Diagnosis Date Noted  . Dyspnea on exertion 03/21/2013    Class: Chronic  . Chest pressure 03/21/2013  . Carotid disease, bilateral ([place]) 03/21/2013  . GERD (gastroesophageal reflux disease)   . OSA (obstructive sleep apnea)   .  HYPERTENSION 06/09/2007    Past Surgical History:  Procedure Laterality Date  . Fallopian tube cystectomy    . hysterectomy and BSO    . KNEE ARTHROSCOPY    . left TKR    . osteotomy, right leg x2, with bone graft    . partial colectomy for recurrent diverticulitis      OB History    No data available       Home Medications    Prior to Admission medications   Medication Sig Start Date End Date Taking? Authorizing Provider  aspirin EC 81 MG tablet Take 81 mg by mouth daily.    [provider]  atenolol (TENORMIN) 25 MG tablet Take 25 mg by mouth daily.     [provider]  diltiazem (DILACOR XR) 120 MG 24 hr capsule Take 120 mg by mouth daily.    [provider]  losartan (COZAAR) 100 MG tablet Take 100 mg by mouth daily.    [provider]  Multiple Vitamin (MULTIVITAMIN) capsule Take 1 capsule by mouth daily.    [provider]  omeprazole (PRILOSEC) 20 MG capsule Take 20 mg by mouth daily.    [provider]  Polyethyl Glycol-Propyl Glycol (SYSTANE OP) Apply 2 drops to eye 4 (four) times daily as needed (dry eyes).    [provider]  rosuvastatin (CRESTOR) 10 MG tablet Take 10 mg by mouth daily.    [provider]    Family History History reviewed. No pertinent family history.  Social History Social History  Substance Use Topics  . Smoking status: Former Research scientist (life sciences)  . Smokeless tobacco: Never Used     Comment: QUIT SINCE 76YEARS OLD  . Alcohol use No     Comment: OCCASIONAL     Allergies   Penicillins; Dilaudid [hydromorphone hcl]; Hctz [hydrochlorothiazide]; and Morphine and related   Review of Systems Review of Systems  Constitutional: Negative for chills, diaphoresis and fever.  HENT: Positive for voice change.   Respiratory: Positive for shortness of breath.   Cardiovascular: Positive for chest pain.  Gastrointestinal: Positive for nausea. Negative for abdominal pain, diarrhea and  vomiting.  Skin: Positive for rash.  Neurological: Negative for weakness and numbness.  All other systems reviewed and are negative.    Physical Exam Updated Vital Signs BP (!) 184/74 (BP Location: Left Arm)   Pulse (!) 55   Temp 97.6 F (36.4 C) (Oral)   Resp 18   SpO2 93%   Physical Exam  Constitutional: She appears well-developed and well-nourished. No distress.  HENT:  Head: Normocephalic and atraumatic.  Mouth/Throat: Oropharynx is clear and moist.  No noted angioedema or intraoral lesions or swelling.  Eyes: Conjunctivae are normal.  Neck: Neck supple.  Cardiovascular: Normal rate, regular rhythm, normal heart sounds and intact distal pulses.   Pulmonary/Chest: Effort normal and breath sounds normal. No respiratory distress. She exhibits no tenderness.  Patient speaks in full sentences without difficulty. No noted increased work of breathing.  Abdominal: Soft. There is no tenderness. There is no guarding.  Musculoskeletal: She exhibits no edema.  Lymphadenopathy:    She has no cervical adenopathy.  Neurological: She is alert.  Skin: Skin is warm and dry. Rash noted. She is not diaphoretic.  Patient has a erythematous macular rash noted only to the left arm. Does not appear urticarial.  Psychiatric: She has a normal mood and affect. Her behavior is normal.  Nursing note and vitals reviewed.    ED Treatments / Results  Labs (all labs ordered are listed, but only abnormal results are displayed) Labs Reviewed  BASIC METABOLIC PANEL - Abnormal; Notable for the following:       Result Value   Glucose, Bld 114 (*)    BUN 22 (*)    Creatinine, Ser 1.01 (*)    Calcium 8.8 (*)    GFR calc non Af Amer 53 (*)    All other components within normal limits  CBC  TROPONIN I  I-STAT TROPONIN, ED    EKG  EKG Interpretation  Date/Time:  Thursday December 16 2016 01:10:05 EDT Ventricular Rate:  63 PR Interval:  176 QRS Duration: 74 QT Interval:  434 QTC  Calculation: 444 R Axis:   22 Text Interpretation:  Sinus bradycardia with occasional Premature ventricular complexes Otherwise normal ECG No significant change since last tracing Confirmed by Pryor Curia [phone]) on 12/16/2016 4:48:26 AM       Radiology Dg Chest 2 View  Result Date: 12/16/2016 CLINICAL DATA:  Mid chest pain and shortness of breath tonight. EXAM: CHEST  2 VIEW COMPARISON:  01/24/2007 FINDINGS: Slight fibrosis in the lung bases is similar previous study. Heart size and pulmonary vascularity are normal. No airspace disease or consolidation in the lungs. No blunting of costophrenic angles. No pneumothorax. Mediastinal contours appear intact. IMPRESSION: Linear fibrosis in the lung bases similar prior study. No evidence of active pulmonary disease. Electronically Signed   By: Gwyndolyn Saxon  Gerilyn Nestle M.D.   On: 12/16/2016 01:51    Procedures Procedures (including critical care time)  Medications Ordered in ED Medications  aspirin chewable tablet 324 mg (not administered)  sodium chloride 0.9 % bolus 500 mL (500 mLs Intravenous New Bag/Given 12/16/16 0455)  methylPREDNISolone sodium succinate (SOLU-MEDROL) 125 mg/2 mL injection 80 mg (80 mg Intravenous Given 12/16/16 0454)  famotidine (PEPCID) IVPB 20 mg premix (20 mg Intravenous New Bag/Given 12/16/16 0455)  diphenhydrAMINE (BENADRYL) injection 25 mg (25 mg Intravenous Given 12/16/16 0455)     Initial Impression / Assessment and Plan / ED Course  I have reviewed the triage vital signs and the nursing notes.  Pertinent labs & imaging results that were available during my care of the patient were reviewed by me and considered in my medical decision making (see chart for details).  Clinical Course as of Dec 16 540  Thu Dec 16, 2016  0540 Spoke with Dr. Tamala Julian, hospitalist, who agreed to admit the patient.  [SJ]    Clinical Course User Index [SJ] Paula Busenbark C, PA-C    Patient presents with a myriad of symptoms that she paints as a  possible allergic reaction. However, what is known is that patient has now had 2 episodes of chest pain and shortness of breath with no definite or known provocation. She will be covered with medications to treat allergic reaction, however, she will also need admission for chest pain rule out.   Findings and plan of care discussed with [facility], DO. Dr. Leonides Schanz personally evaluated and examined this patient.   Vitals:   12/16/16 0127 12/16/16 0430 12/16/16 0445 12/16/16 0500  BP: (!) 184/74 (!) 157/75 (!) 161/72 (!) 193/77  Pulse: (!) 55 (!) 52 (!) 52 (!) 58  Resp: 18 17 16 16   Temp: 97.6 F (36.4 C)     TempSrc: Oral     SpO2: 93% 93% 94% 95%     Final Clinical Impressions(s) / ED Diagnoses   Final diagnoses:  Chest pain, unspecified type  Shortness of breath    New Prescriptions New Prescriptions   No medications on file     Lorayne Bender, Hershal Coria 12/16/16 5436

## 2016-12-16 NOTE — H&P (Signed)
History and Physical    PEARLINE YERBY ZOX:[id number] DOB: March 05, 1941 DOA: 12/16/2016   PCP: Merlene Laughter, MD   Patient coming from:  Home    Chief Complaint: Chest pain   HPI: MONNIE GUDGEL is a 76 y.o. female with medical history significant for HTN, HLD, OSA, presenting to the ED with shortness of breath and chest pain since around 11 PM last night, felt like it squeezing, 8 out of 10 retro sternal, radiating to the right chest. The patient tried inhaler without improvement. At the time, the patient felt that she had a rash to her next chest arms, with nausea and is sad and urge to have a bowel movement, and feeling of throat swelling, and felt that these may have been related to peanuts,  taking 25 mg of Benadryl, with symptoms improving within about 15 minutes. Because of that, she initially  hesitated to come to the ER, as she was suspecting allergies to peanuts.  However, the symptoms although less pronounced, still present. She denies any syncope or presyncope. She denies any headaches or vision changes. No sick contacts. She denies any tobacco, alcohol or recreational drugs. She is compliant with her medications. She denies any leg swelling or calf pain. No recent long distance trips. She denies any new stressors. No new medications. She denies being mean hormonal therapy.  ED Course:  BP (!) 180/78   Pulse (!) 59   Temp 97.6 F (36.4 C) (Oral)   Resp 18   SpO2 92%    troponin less than 0.03x 2 glucose 114 creatinine 1.01 CBC normal chest x-ray Linear fibrosis in the lung bases similar prior study. No evidence of active pulmonary disease. EKG on SR 64 w occasional Premature ventricular complexes No significant change since last tracing last nuclear test on 03/19/2016 showed EF 64%, normal study. Received IVF 500 cc bolus   Review of Systems:  As per HPI otherwise all other systems reviewed and are negative  Past Medical History:  Diagnosis Date  . CAD (coronary  artery disease)   . GERD (gastroesophageal reflux disease)   . HTN (hypertension)   . OSA (obstructive sleep apnea)     Past Surgical History:  Procedure Laterality Date  . Fallopian tube cystectomy    . hysterectomy and BSO    . KNEE ARTHROSCOPY    . left TKR    . osteotomy, right leg x2, with bone graft    . partial colectomy for recurrent diverticulitis      Social History Social History   Social History  . Marital status: Married    Spouse name: N/A  . Number of children: N/A  . Years of education: N/A   Occupational History  . Not on file.   Social History Main Topics  . Smoking status: Former Games developer  . Smokeless tobacco: Never Used     Comment: QUIT SINCE 76YEARS OLD  . Alcohol use No     Comment: OCCASIONAL  . Drug use: No  . Sexual activity: Not on file   Other Topics Concern  . Not on file   Social History Narrative  . No narrative on file     Allergies  Allergen Reactions  . Penicillins Swelling  . Dilaudid [Hydromorphone Hcl] Other (See Comments)    Almost convulsive  . Hctz [Hydrochlorothiazide] Itching  . Morphine And Related Other (See Comments)    unknown    History reviewed. No pertinent family history.    Prior to Admission medications  Medication Sig Start Date End Date Taking? Authorizing Provider  aspirin EC 81 MG tablet Take 81 mg by mouth daily.    [provider]  atenolol (TENORMIN) 25 MG tablet Take 25 mg by mouth daily.     [provider]  diltiazem (DILACOR XR) 120 MG 24 hr capsule Take 120 mg by mouth daily.    [provider]  losartan (COZAAR) 100 MG tablet Take 100 mg by mouth daily.    [provider]  Multiple Vitamin (MULTIVITAMIN) capsule Take 1 capsule by mouth daily.    [provider]  omeprazole (PRILOSEC) 20 MG capsule Take 20 mg by mouth daily.    [provider]  Polyethyl Glycol-Propyl Glycol (SYSTANE OP) Apply 2 drops to eye 4 (four) times daily as  needed (dry eyes).    [provider]  rosuvastatin (CRESTOR) 10 MG tablet Take 10 mg by mouth daily.    [provider]    Physical Exam:  Vitals:   12/16/16 0645 12/16/16 0700 12/16/16 0741 12/16/16 0745  BP: (!) 188/69 (!) 194/72 (!) 182/83 (!) 180/78  Pulse: 60 64 79 (!) 59  Resp: 18 (!) 24 18 18   Temp:      TempSrc:      SpO2: 91% 94% 94% 92%   Constitutional: NAD, calm, uncomfortable due to symptoms  Eyes: PERRL, lids and conjunctivae normal ENMT: Mucous membranes are moist, without exudate or lesions  Neck: normal, supple, no masses, no thyromegaly Respiratory: clear to auscultation bilaterally, no wheezing, no crackles. Normal respiratory effort  Cardiovascular: Regular rate and rhythm, soft 1/6 systolic  murmurs, rubs or gallops. No extremity edema. 2+ pedal pulses. No carotid bruits.  Abdomen: Soft, non tender, No hepatosplenomegaly. Bowel sounds positive.  Musculoskeletal: no clubbing / cyanosis. Moves all extremities Skin: no jaundice, No lesions.  Neurologic: Sensation intact  Strength equal in all extremities Psychiatric:   Alert and oriented x 3. Normal mood.     Labs on Admission: I have personally reviewed following labs and imaging studies  CBC:  Recent Labs Lab 12/16/16 0115  WBC 7.6  HGB 13.6  HCT 42.4  MCV 95.9  PLT 283    Basic Metabolic Panel:  Recent Labs Lab 12/16/16 0115  NA 139  K 4.0  CL 105  CO2 25  GLUCOSE 114*  BUN 22*  CREATININE 1.01*  CALCIUM 8.8*    GFR: CrCl cannot be calculated (Unknown ideal weight.).  Liver Function Tests: No results for input(s): AST, ALT, ALKPHOS, BILITOT, PROT, ALBUMIN in the last 168 hours. No results for input(s): LIPASE, AMYLASE in the last 168 hours. No results for input(s): AMMONIA in the last 168 hours.  Coagulation Profile: No results for input(s): INR, PROTIME in the last 168 hours.  Cardiac Enzymes:  Recent Labs Lab 12/16/16 0115 12/16/16 0610  TROPONINI  <0.03 <0.03    BNP (last 3 results) No results for input(s): PROBNP in the last 8760 hours.  HbA1C: No results for input(s): HGBA1C in the last 72 hours.  CBG: No results for input(s): GLUCAP in the last 168 hours.  Lipid Profile: No results for input(s): CHOL, HDL, LDLCALC, TRIG, CHOLHDL, LDLDIRECT in the last 72 hours.  Thyroid Function Tests: No results for input(s): TSH, T4TOTAL, FREET4, T3FREE, THYROIDAB in the last 72 hours.  Anemia Panel: No results for input(s): VITAMINB12, FOLATE, FERRITIN, TIBC, IRON, RETICCTPCT in the last 72 hours.  Urine analysis:    Component Value Date/Time   COLORURINE YELLOW 12/01/2009  1531   APPEARANCEUR CLEAR 12/01/2009 1531   LABSPEC 1.015 12/01/2009 1531   PHURINE 5.5 12/01/2009 1531   GLUCOSEU NEGATIVE 12/01/2009 1531   HGBUR NEGATIVE 12/01/2009 1531   BILIRUBINUR NEGATIVE 12/01/2009 1531   KETONESUR NEGATIVE 12/01/2009 1531   PROTEINUR NEGATIVE 12/01/2009 1531   UROBILINOGEN 0.2 12/01/2009 1531   NITRITE NEGATIVE 12/01/2009 1531   LEUKOCYTESUR  12/01/2009 1531    NEGATIVE MICROSCOPIC NOT DONE ON URINES WITH NEGATIVE PROTEIN, BLOOD, LEUKOCYTES, NITRITE, OR GLUCOSE <1000 mg/dL.    Sepsis Labs: @LABRCNTIP (procalcitonin:4,lacticidven:4) )No results found for this or any previous visit (from the past 240 hour(s)).   Radiological Exams on Admission: Dg Chest 2 View  Result Date: 12/16/2016 CLINICAL DATA:  Mid chest pain and shortness of breath tonight. EXAM: CHEST  2 VIEW COMPARISON:  01/24/2007 FINDINGS: Slight fibrosis in the lung bases is similar previous study. Heart size and pulmonary vascularity are normal. No airspace disease or consolidation in the lungs. No blunting of costophrenic angles. No pneumothorax. Mediastinal contours appear intact. IMPRESSION: Linear fibrosis in the lung bases similar prior study. No evidence of active pulmonary disease. Electronically Signed   By: Burman NievesWilliam  Stevens M.D.   On: 12/16/2016 01:51    EKG:  Independently reviewed.  Assessment/Plan Active Problems:   Chest pain   Essential hypertension   GERD (gastroesophageal reflux disease)   OSA (obstructive sleep apnea)      Chest pain syndrome/known CAD, rule out GI  HEART score 3-4. Troponin less than 0.03, EKG without evidence of ACS. CP unrelieved by nitroglycerin, aspirin. CXR unrevealing. Of note, the patient has peanut allergy, and she reports that these symptoms may have been exacerbated by possible peanut injection. For that, at the ED she received Solu-Medrol IV, as well as Benadryl and Pepcid Last nuclear test on 03/19/2016 showed EF 64%, normal study..    Admit to Telemetry/ Observation  Chest pain order set Cycle troponins EKG in am continue ASA, O2 and NTG as needed Statins  GI cocktail Check Lipid panel  Hb A1C   Hypertension BP  180/78   Pulse 59  Controlled Continue home anti-hypertensive medications     Hyperlipidemia Continue home statins  GERD, no acute symptoms Continue PPI  OSA Continue CPAP nightly   DVT prophylaxis: Heparin  Code Status:   Full     Family Communication:  Discussed with patient Disposition Plan: Expect patient to be discharged to home after condition improves Consults called:    None  Admission status:Tele Obs      Claire Bridge E, PA-C [facility]   12/16/2016, 8:00 AM

## 2016-12-16 NOTE — Progress Notes (Signed)
  Echocardiogram 2D Echocardiogram has been performed.  Diana [name], Diana [name] M 12/16/2016, 3:32 PM

## 2016-12-17 ENCOUNTER — Other Ambulatory Visit: Payer: Self-pay

## 2016-12-17 ENCOUNTER — Encounter (HOSPITAL_COMMUNITY): Payer: Self-pay | Admitting: Student

## 2016-12-17 DIAGNOSIS — R0789 Other chest pain: Secondary | ICD-10-CM | POA: Diagnosis not present

## 2016-12-17 DIAGNOSIS — R079 Chest pain, unspecified: Secondary | ICD-10-CM

## 2016-12-17 DIAGNOSIS — G4733 Obstructive sleep apnea (adult) (pediatric): Secondary | ICD-10-CM | POA: Diagnosis not present

## 2016-12-17 DIAGNOSIS — I10 Essential (primary) hypertension: Secondary | ICD-10-CM | POA: Diagnosis not present

## 2016-12-17 DIAGNOSIS — I160 Hypertensive urgency: Secondary | ICD-10-CM | POA: Diagnosis not present

## 2016-12-17 DIAGNOSIS — K219 Gastro-esophageal reflux disease without esophagitis: Secondary | ICD-10-CM | POA: Diagnosis not present

## 2016-12-17 LAB — COMPREHENSIVE METABOLIC PANEL
ALK PHOS: 36 U/L — AB (ref 38–126)
ALT: 18 U/L (ref 14–54)
AST: 21 U/L (ref 15–41)
Albumin: 3.3 g/dL — ABNORMAL LOW (ref 3.5–5.0)
Anion gap: 6 (ref 5–15)
BUN: 16 mg/dL (ref 6–20)
CALCIUM: 9 mg/dL (ref 8.9–10.3)
CO2: 25 mmol/L (ref 22–32)
CREATININE: 0.93 mg/dL (ref 0.44–1.00)
Chloride: 109 mmol/L (ref 101–111)
GFR, EST NON AFRICAN AMERICAN: 59 mL/min — AB (ref >60)
Glucose, Bld: 134 mg/dL — ABNORMAL HIGH (ref 65–99)
Potassium: 3.9 mmol/L (ref 3.5–5.1)
Sodium: 140 mmol/L (ref 135–145)
Total Bilirubin: 0.6 mg/dL (ref 0.3–1.2)
Total Protein: 5.7 g/dL — ABNORMAL LOW (ref 6.5–8.1)

## 2016-12-17 LAB — CBC
HEMATOCRIT: 39 % (ref 36.0–46.0)
Hemoglobin: 12.4 g/dL (ref 12.0–15.0)
MCH: 30.8 pg (ref 26.0–34.0)
MCHC: 31.8 g/dL (ref 30.0–36.0)
MCV: 96.8 fL (ref 78.0–100.0)
Platelets: 264 10*3/uL (ref 150–400)
RBC: 4.03 MIL/uL (ref 3.87–5.11)
RDW: 13.2 % (ref 11.5–15.5)
WBC: 8.6 10*3/uL (ref 4.0–10.5)

## 2016-12-17 MED ORDER — AMLODIPINE BESYLATE 5 MG PO TABS: 5.0000 mg | ORAL_TABLET | Freq: Every day | ORAL | 0 refills | Status: AC

## 2016-12-17 MED ORDER — AMLODIPINE BESYLATE 5 MG PO TABS
5.0000 mg | ORAL_TABLET | Freq: Every day | ORAL | Status: DC
  Administered 2016-12-17: 5 mg via ORAL
  Filled 2016-12-17: qty 1

## 2016-12-17 NOTE — Progress Notes (Signed)
Reviewed all discharge instructions.  Patient stated understanding.  No voiced complaints.   

## 2016-12-17 NOTE — Progress Notes (Signed)
CM talked to patient about CPAP machine that was ordered by Dr Theressa MillardJames Osborne at the [facility]; patient stated that he had sent her information about where to go to get it but lost the email. Message left at the [facility] to contact the patient for information on where to purchase her machine; Alexis GoodellB Dream Nodal RN,MHA,BSN [phone]

## 2016-12-17 NOTE — Consult Note (Signed)
Cardiology Consult    Patient ID: Diana [name] MRN: [id number], DOB/AGE: 76/08/1940   Admit date: 12/16/2016 Date of Consult: 12/17/2016  Primary Physician: Merlene LaughterStoneking, Hal, MD Reason for Consult: Chest Pain Primary Cardiologist: Dr. Katrinka BlazingSmith (Last Evaluated in 2014) Requesting Provider: Dr. Cena BentonVega   History of Present Illness    Diana OursRebecca F [name] is a 76 y.o. female with past medical history of HTN, HLD, GERD, and OSA (not on CPAP) who is being seen today for the evaluation of chest pain at the request of Dr. Cena BentonVega.   She was last examined by Dr. Katrinka BlazingSmith in 03/2013 and reported chest pressure at that time which was present at rest and worse with positional changes. NST showed no evidence of ischemia and was overall a low-risk study.  A repeat NST was performed in 03/2016 which showed no evidence of ischemia and was overall a low-risk study.   In talking with the patient, she reports having progressive fatigue and little energy for the past 3-4 months but is very active at baseline in taking care of her home and renovating two other homes. She denies any recent chest pain or dyspnea on exertion up until this past Monday. On Monday afternoon, she was at a restaurant with family when she developed a tightness along her sternum after consuming two bites of food. This lasted for approximately 30 minutes than spontaneously resolved. She has experienced dysphagia symptoms for the past several months and underwent a barium swallow in 09/2016 which showed no acute abnormalities. On Wednesday night at approximately 2300, she laid down to go to sleep and developed a recurrent tightness along her sternum which radiated into her right-pectoral region. This lasted for hours and was worse with deep breathing. No association with exertion. Due to her persistent symptoms, she came to the ED for evaluation.   Initial labs show WBC of 7.6, Hgb 13.6, platelets 283. Na+ 139, K+ 4.0, and creatinine 1.01. Initial  and cyclic troponin values have been negative. CXR shows linear fibrosis in the lung bases as noted on prior studies but no evidence of active pulmonary disease. EKG shows NSR, HR 75, with PVC's. Echo shows a preserved EF of 60-65%, no regional WMA, Grade 1 DD, and moderate TR.   She denies any repeat episodes of chest pressure since admission. No known history of CAD or prior MI's. She does have a family history of CAD with her father dying from an MI in his 6460's. She previously smoked but quit in her 3230's. Notes social alcohol use.   Past Medical History   Past Medical History:  Diagnosis Date  . Chest pain    a. low-risk nuclear stress test in 03/2013 and 03/2016  . GERD (gastroesophageal reflux disease)   . HTN (hypertension)   . OSA (obstructive sleep apnea)     Past Surgical History:  Procedure Laterality Date  . Fallopian tube cystectomy    . hysterectomy and BSO    . KNEE ARTHROSCOPY    . left TKR    . osteotomy, right leg x2, with bone graft    . partial colectomy for recurrent diverticulitis       Allergies  Allergies  Allergen Reactions  . Penicillins Swelling  . Dilaudid [Hydromorphone Hcl] Other (See Comments)    Almost convulsive  . Hctz [Hydrochlorothiazide] Itching  . Morphine And Related Other (See Comments)    unknown    Inpatient Medications    . amLODipine  5 mg Oral Daily  .  aspirin EC  325 mg Oral Daily  . atenolol  25 mg Oral Q breakfast  . guaiFENesin  600 mg Oral BID  . heparin  5,000 Units Subcutaneous Q8H  . losartan  100 mg Oral Daily  . pantoprazole  40 mg Oral Daily  . rosuvastatin  10 mg Oral Daily    Family History    History reviewed. No pertinent family history.  Social History    Social History   Social History  . Marital status: Married    Spouse name: N/A  . Number of children: N/A  . Years of education: N/A   Occupational History  . Not on file.   Social History Main Topics  . Smoking status: Former Games developermoker  .  Smokeless tobacco: Never Used     Comment: QUIT SINCE [id number] OLD  . Alcohol use No     Comment: OCCASIONAL  . Drug use: No  . Sexual activity: Not on file   Other Topics Concern  . Not on file   Social History Narrative  . No narrative on file     Review of Systems    General:  No chills, fever, night sweats or weight changes. Positive for fatigue.  Cardiovascular:  No dyspnea on exertion, edema, orthopnea, palpitations, paroxysmal nocturnal dyspnea. Positive for chest pain.  Dermatological: No rash, lesions/masses Respiratory: No cough, dyspnea Urologic: No hematuria, dysuria Abdominal:   No nausea, vomiting, diarrhea, bright red blood per rectum, melena, or hematemesis Neurologic:  No visual changes, wkns, changes in mental status.  All other systems reviewed and are otherwise negative except as noted above.  Physical Exam    Blood pressure (!) 162/64, pulse (!) 51, temperature 97.7 F (36.5 C), temperature source Oral, resp. rate 20, height 5\' 7"  (1.702 m), weight 227 lb 6.4 oz (103.1 kg), SpO2 96 %.  General: Pleasant, overweight Caucasian female appearing in NAD Psych: Normal affect. Neuro: Alert and oriented X 3. Moves all extremities spontaneously. HEENT: Normal  Neck: Supple without bruits or JVD. Lungs:  Resp regular and unlabored, CTA without wheezing or rales. Heart: RRR no s3, s4, or murmurs. Abdomen: Soft, non-tender, non-distended, BS + x 4.  Extremities: No clubbing, cyanosis or edema. DP/PT/Radials 2+ and equal bilaterally.  Labs    Troponin (Point of Care Test) No results for input(s): TROPIPOC in the last 72 hours.  Recent Labs  12/16/16 0115 12/16/16 0610 12/16/16 0919 12/16/16 1343  TROPONINI <0.03 <0.03 <0.03 <0.03   Lab Results  Component Value Date   WBC 8.6 12/17/2016   HGB 12.4 12/17/2016   HCT 39.0 12/17/2016   MCV 96.8 12/17/2016   PLT 264 12/17/2016     Recent Labs Lab 12/17/16 0404  NA 140  K 3.9  CL 109  CO2 25  BUN  16  CREATININE 0.93  CALCIUM 9.0  PROT 5.7*  BILITOT 0.6  ALKPHOS 36*  ALT 18  AST 21  GLUCOSE 134*   No results found for: CHOL, HDL, LDLCALC, TRIG No results found for: [facility]   Radiology Studies    Dg Chest 2 View  Result Date: 12/16/2016 CLINICAL DATA:  Mid chest pain and shortness of breath tonight. EXAM: CHEST  2 VIEW COMPARISON:  01/24/2007 FINDINGS: Slight fibrosis in the lung bases is similar previous study. Heart size and pulmonary vascularity are normal. No airspace disease or consolidation in the lungs. No blunting of costophrenic angles. No pneumothorax. Mediastinal contours appear intact. IMPRESSION: Linear fibrosis in the lung bases similar prior  study. No evidence of active pulmonary disease. Electronically Signed   By: Burman Nieves M.D.   On: 12/16/2016 01:51    EKG & Cardiac Imaging    EKG:  NSR, HR 75, with PVC's - Personally Reviewed  Echocardiogram: 12/16/2016 Study Conclusions  - Left ventricle: The cavity size was normal. There was severe   focal basal and mild concentric hypertrophy of the septum.   Systolic function was normal. The estimated ejection fraction was   in the range of 60% to 65%. Wall motion was normal; there were no   regional wall motion abnormalities. Doppler parameters are   consistent with abnormal left ventricular relaxation (grade 1   diastolic dysfunction). Doppler parameters are consistent with   indeterminate ventricular filling pressure. - Aortic valve: Transvalvular velocity was within the normal range.   There was no stenosis. There was no regurgitation. - Mitral valve: Transvalvular velocity was within the normal range.   There was no evidence for stenosis. There was no regurgitation. - Left atrium: The atrium was mildly dilated. - Right ventricle: The cavity size was normal. Wall thickness was   normal. Systolic function was normal. - Tricuspid valve: There was moderate regurgitation. - Pulmonary arteries: Systolic  pressure was mildly increased. PA   peak pressure: 45 mm Hg (S). - Global longitudinal strain -24.1%.  NST: 04/2013 Impression Exercise Capacity:  Lexiscan with low level exercise. BP Response:  Normal blood pressure response. Clinical Symptoms:  Mild chest pain/dyspnea. ECG Impression:  No significant ST segment change suggestive of ischemia. Comparison with Prior Nuclear Study: No images to compare  Overall Impression:  Low risk stress nuclear study with anterior fixed defect due to breast attenuation.. No ischemia noted.  LV Ejection Fraction: 71%.  LV Wall Motion:  Normal Wall Motion  NST: 03/2016  Nuclear stress EF: 64%.  There was no ST segment deviation noted during stress.  The study is normal.  This is a low risk study.  The left ventricular ejection fraction is normal (55-65%).  BP was markedly elevated at the beginning of the study and at the end of the study.   Assessment & Plan    1. Atypical Chest Pain - she reports two episodes of chest pain within the past week, the first occurring after consuming food and lasting for 30 minutes prior to spontaneously resolving. Her second episode occurred after lying down to go to sleep and she describes this as a burning sensation along her sternum which was worse with deep inspiration. - cyclic troponin values have been negative and EKG shows no acute ischemic changes.  - Echo shows a preserved EF of 60-65%, no regional WMA, Grade 1 DD, and moderate TR.  - a recent NST was performed in 03/2016 which showed no evidence of ischemia and was overall a low-risk study. - with her atypical symptoms, negative cardiac enzymes, and echo showing a normal EF with no WMA, would not pursue further ischemic evaluation at this time. If she continued to experience recurrent symptoms, could consider a Coronary CT but would not be ideal in the setting of her body habitus and would likely need a catheterization.    2. HTN - BP has been  elevated at 144/55 - 187/73 in the past 24 hours.  - continue PTA Losartan and Atenolol. Will discontinue Cardizem and start Amlodipine 5mg  daily for improved BP control. Can titrate up to 10mg  daily if needed.  3. HLD - continue Crestor 10mg  daily.  4. GERD - recent Barium  swallow without acute findings.  - continue PPI therapy.   Signed, Ellsworth Lennox, PA-C 12/17/2016, 2:14 PM Pager: [phone]   The patient was seen, examined and discussed with Randall An, PA-C and I agree with the above.    76 y.o. female with past medical history of HTN, HLD, GERD, and OSA (not on CPAP) who is being seen today for the evaluation of chest pain at the request of Dr. Cena Benton. She was last examined by Dr. Katrinka Blazing in 03/2013 and reported chest pressure at that time which was present at rest and worse with positional changes. NST showed no evidence of ischemia and was overall a low-risk study. A repeat NST was performed in 03/2016 which showed no evidence of ischemia and was overall a low-risk study (personally reviewed.  The patient was asymptomatic ever since then. She has had 2 episodes of chest pain, the first one while eating food in restaurant it started as reflex with bile-like taste in her mouth. It got worse with shortness of breath and chest tightness. The patient states that her blood pressure was 190 at the time and oral has been poorly controlled in the last couple weeks. She has experienced another episode like this 2 days ago while at home laying in bed the pain felt squeezing like associated with shortness of breath and radiation to her right shoulder. Troponin was negative 3. Physical exam doesn't reveal any signs of heart failure. Her blood pressure has been persistently elevated in the hospital in the range from 160-190. She has also been bradycardic. Her EKG shows sinus rhythm with LVH and no acute ST-T wave abnormalities. A few PVCs. Echocardiogram shows severe LVH with normal LV EF  and normal strain parameters. Echocardiogram also shows moderate pulmonary hypertension.  Considering that she had a normal stress test in November 2017, that her strain echocardiogram is normal and her presentation was atypical this is most probably not presentation of typical angina but can be a demand ischemia in the settings of hypertensive urgency.  I will discontinue her diltiazem and add amlodipine 5 mg daily to be started now. This should be uptitrated to 10 mg daily if needed. This would also help with pulmonary hypertension. She doesn't have LE edema. She is allergic to HCTZ (severe cramping).  Tobias Alexander, MD 12/17/2016

## 2016-12-17 NOTE — Discharge Summary (Signed)
Physician Discharge Summary  Diana [name] ZOX:[id number] DOB: 06/27/1940 DOA: 12/16/2016  PCP: Merlene LaughterStoneking, Hal, MD  Admit date: 12/16/2016 Discharge date: 12/17/2016  Time spent: > 35 minutes  Recommendations for Outpatient Follow-up:  1. Ensure better blood pressure control   Discharge Diagnoses:  Active Problems:   Essential hypertension   GERD (gastroesophageal reflux disease)   OSA (obstructive sleep apnea)   Chest pain   Discharge Condition: Stable  Diet recommendation: Low sodium heart healthy  Filed Weights   12/16/16 1844 12/17/16 0555  Weight: 104.5 kg (230 lb 4.8 oz) 103.1 kg (227 lb 6.4 oz)    History of present illness:  The patient is a 76 year old female past medical history significant for GERD, hyperlipidemia, hypertension, OSA not on C Pap. She presented to the hospital after developing chest discomfort. As such workup went underway to rule out ACS  Hospital Course:  Chest pain -EKG did not have any ST elevations or depressions, troponins were negative 3 - Given reports of chest tightness I consulted cardiology who recommended the following: Atypical Chest Pain - she reports two episodes of chest pain within the past week, the first occurring after consuming food and lasting for 30 minutes prior to spontaneously resolving. Her second episode occurred after lying down to go to sleep and she describes this as a burning sensation along her sternum which was worse with deep inspiration. - cyclic troponin values have been negative and EKG shows no acute ischemic changes.  - Echo shows a preserved EF of 60-65%, no regional WMA, Grade 1 DD, and moderate TR.  - a recent NST was performed in 03/2016 which showed no evidence of ischemia and was overall a low-risk study. - with her atypical symptoms, negative cardiac enzymes, and echo showing a normal EF with no WMA, would not pursue further ischemic evaluation at this time. If she continued to experience recurrent  symptoms, could consider a Coronary CT but would not be ideal in the setting of her body habitus and would likely need a catheterization.    Considering that she had a normal stress test in November 2017, that her strain echocardiogram is normal and her presentation was atypical this is most probably not presentation of typical angina but can be a demand ischemia in the settings of hypertensive urgency.  I will discontinue her diltiazem and add amlodipine 5 mg daily to be started now. This should be uptitrated to 10 mg daily if needed. This would also help with pulmonary hypertension. She doesn't have LE edema. She is allergic to HCTZ (severe cramping).   For other known medical problems listed above will continue home medication regimen listed below   Procedures:  None  Consultations:  Cardiology  Discharge Exam: Vitals:   12/17/16 1002 12/17/16 1148  BP: (!) 157/70 (!) 162/64  Pulse: 66 (!) 51  Resp:  20  Temp:  97.7 F (36.5 C)    General: Pt in nad, alert and awake Cardiovascular: rrr, no rubs Respiratory: no increased wob, no wheezes  Discharge Instructions   Discharge Instructions    Call MD for:  difficulty breathing, headache or visual disturbances    Complete by:  As directed    Call MD for:  temperature >100.4    Complete by:  As directed    Diet - low sodium heart healthy    Complete by:  As directed    Discharge instructions    Complete by:  As directed    Please follow-up with your primary  care physician for further titration of her blood pressure medications in order to get better control of your hypertension.   Increase activity slowly    Complete by:  As directed      Current Discharge Medication List    START taking these medications   Details  amLODipine (NORVASC) 5 MG tablet Take 1 tablet (5 mg total) by mouth daily. Qty: 30 tablet, Refills: 0      CONTINUE these medications which have NOT CHANGED   Details  albuterol (PROVENTIL  HFA;VENTOLIN HFA) 108 (90 Base) MCG/ACT inhaler Inhale 1-2 puffs into the lungs every 6 (six) hours as needed for wheezing or shortness of breath.    aspirin EC 81 MG tablet Take 81 mg by mouth daily.    atenolol (TENORMIN) 25 MG tablet Take 25 mg by mouth daily.     calcium carbonate (CALTRATE 600) 1500 (600 Ca) MG TABS tablet Take 600 mg of elemental calcium by mouth daily with breakfast.    Coenzyme Q10 (COQ10 PO) Take 1 tablet by mouth daily.    losartan (COZAAR) 100 MG tablet Take 100 mg by mouth daily.    Multiple Vitamin (MULTIVITAMIN) capsule Take 1 capsule by mouth daily.    omeprazole (PRILOSEC) 20 MG capsule Take 20 mg by mouth daily.    Polyethyl Glycol-Propyl Glycol (SYSTANE OP) Apply 2 drops to eye 4 (four) times daily as needed (dry eyes).    rosuvastatin (CRESTOR) 10 MG tablet Take 10 mg by mouth daily.      STOP taking these medications     diltiazem (DILACOR XR) 120 MG 24 hr capsule        Allergies  Allergen Reactions  . Penicillins Swelling  . Dilaudid [Hydromorphone Hcl] Other (See Comments)    Almost convulsive  . Hctz [Hydrochlorothiazide] Itching  . Morphine And Related Other (See Comments)    unknown      The results of significant diagnostics from this hospitalization (including imaging, microbiology, ancillary and laboratory) are listed below for reference.    Significant Diagnostic Studies: Dg Chest 2 View  Result Date: 12/16/2016 CLINICAL DATA:  Mid chest pain and shortness of breath tonight. EXAM: CHEST  2 VIEW COMPARISON:  01/24/2007 FINDINGS: Slight fibrosis in the lung bases is similar previous study. Heart size and pulmonary vascularity are normal. No airspace disease or consolidation in the lungs. No blunting of costophrenic angles. No pneumothorax. Mediastinal contours appear intact. IMPRESSION: Linear fibrosis in the lung bases similar prior study. No evidence of active pulmonary disease. Electronically Signed   By: Burman NievesWilliam  Stevens M.D.    On: 12/16/2016 01:51    Microbiology: No results found for this or any previous visit (from the past 240 hour(s)).   Labs: Basic Metabolic Panel:  Recent Labs Lab 12/16/16 0115 12/17/16 0404  NA 139 140  K 4.0 3.9  CL 105 109  CO2 25 25  GLUCOSE 114* 134*  BUN 22* 16  CREATININE 1.01* 0.93  CALCIUM 8.8* 9.0   Liver Function Tests:  Recent Labs Lab 12/17/16 0404  AST 21  ALT 18  ALKPHOS 36*  BILITOT 0.6  PROT 5.7*  ALBUMIN 3.3*   No results for input(s): LIPASE, AMYLASE in the last 168 hours. No results for input(s): AMMONIA in the last 168 hours. CBC:  Recent Labs Lab 12/16/16 0115 12/17/16 0404  WBC 7.6 8.6  HGB 13.6 12.4  HCT 42.4 39.0  MCV 95.9 96.8  PLT 283 264   Cardiac Enzymes:  Recent Labs  Lab 12/16/16 0115 12/16/16 0610 12/16/16 0919 12/16/16 1343  TROPONINI <0.03 <0.03 <0.03 <0.03   BNP: BNP (last 3 results) No results for input(s): BNP in the last 8760 hours.  ProBNP (last 3 results) No results for input(s): PROBNP in the last 8760 hours.  CBG:  Recent Labs Lab 12/16/16 0514  GLUCAP 94     Signed:  Penny Pia MD.  [facility] 12/17/2016, 5:56 PM

## 2016-12-24 DIAGNOSIS — K219 Gastro-esophageal reflux disease without esophagitis: Secondary | ICD-10-CM | POA: Diagnosis not present

## 2016-12-24 DIAGNOSIS — I10 Essential (primary) hypertension: Secondary | ICD-10-CM | POA: Diagnosis not present

## 2017-03-28 DIAGNOSIS — Z23 Encounter for immunization: Secondary | ICD-10-CM | POA: Diagnosis not present

## 2017-03-28 DIAGNOSIS — J301 Allergic rhinitis due to pollen: Secondary | ICD-10-CM | POA: Diagnosis not present

## 2017-03-28 DIAGNOSIS — I10 Essential (primary) hypertension: Secondary | ICD-10-CM | POA: Diagnosis not present

## 2017-03-28 DIAGNOSIS — R05 Cough: Secondary | ICD-10-CM | POA: Diagnosis not present

## 2017-05-06 ENCOUNTER — Ambulatory Visit
Admission: RE | Admit: 2017-05-06 | Discharge: 2017-05-06 | Disposition: A | Discharge status: Home / Self Care | Payer: Medicare Other | Source: Ambulatory Visit | Attending: Geriatric Medicine | Admitting: Geriatric Medicine

## 2017-05-06 ENCOUNTER — Other Ambulatory Visit: Payer: Self-pay | Admitting: Geriatric Medicine

## 2017-05-06 DIAGNOSIS — R0609 Other forms of dyspnea: Principal | ICD-10-CM

## 2017-05-06 DIAGNOSIS — Z6837 Body mass index (BMI) 37.0-37.9, adult: Secondary | ICD-10-CM | POA: Diagnosis not present

## 2017-05-06 DIAGNOSIS — Z1231 Encounter for screening mammogram for malignant neoplasm of breast: Secondary | ICD-10-CM | POA: Diagnosis not present

## 2017-05-06 DIAGNOSIS — R0600 Dyspnea, unspecified: Secondary | ICD-10-CM

## 2017-05-06 DIAGNOSIS — R05 Cough: Secondary | ICD-10-CM | POA: Diagnosis not present

## 2017-05-06 DIAGNOSIS — I10 Essential (primary) hypertension: Secondary | ICD-10-CM | POA: Diagnosis not present

## 2017-05-06 DIAGNOSIS — H26493 Other secondary cataract, bilateral: Secondary | ICD-10-CM | POA: Diagnosis not present

## 2017-05-06 DIAGNOSIS — Z803 Family history of malignant neoplasm of breast: Secondary | ICD-10-CM | POA: Diagnosis not present

## 2017-05-06 DIAGNOSIS — Z961 Presence of intraocular lens: Secondary | ICD-10-CM | POA: Diagnosis not present

## 2017-05-06 DIAGNOSIS — H35412 Lattice degeneration of retina, left eye: Secondary | ICD-10-CM | POA: Diagnosis not present

## 2017-05-27 DIAGNOSIS — H26493 Other secondary cataract, bilateral: Secondary | ICD-10-CM | POA: Diagnosis not present

## 2017-05-27 DIAGNOSIS — Z961 Presence of intraocular lens: Secondary | ICD-10-CM | POA: Diagnosis not present

## 2017-06-10 DIAGNOSIS — Z961 Presence of intraocular lens: Secondary | ICD-10-CM | POA: Diagnosis not present

## 2017-06-10 DIAGNOSIS — H26491 Other secondary cataract, right eye: Secondary | ICD-10-CM | POA: Diagnosis not present

## 2017-07-07 ENCOUNTER — Other Ambulatory Visit: Payer: Self-pay | Admitting: Geriatric Medicine

## 2017-07-07 DIAGNOSIS — R1013 Epigastric pain: Secondary | ICD-10-CM

## 2017-07-07 DIAGNOSIS — Z79899 Other long term (current) drug therapy: Secondary | ICD-10-CM | POA: Diagnosis not present

## 2017-07-07 DIAGNOSIS — I10 Essential (primary) hypertension: Secondary | ICD-10-CM | POA: Diagnosis not present

## 2017-08-11 DIAGNOSIS — S99922A Unspecified injury of left foot, initial encounter: Secondary | ICD-10-CM | POA: Diagnosis not present

## 2017-08-30 DIAGNOSIS — S90122D Contusion of left lesser toe(s) without damage to nail, subsequent encounter: Secondary | ICD-10-CM | POA: Diagnosis not present

## 2017-09-28 DIAGNOSIS — Z0000 Encounter for general adult medical examination without abnormal findings: Secondary | ICD-10-CM | POA: Diagnosis not present

## 2017-09-28 DIAGNOSIS — I700 Atherosclerosis of aorta: Secondary | ICD-10-CM | POA: Diagnosis not present

## 2017-09-28 DIAGNOSIS — G4733 Obstructive sleep apnea (adult) (pediatric): Secondary | ICD-10-CM | POA: Diagnosis not present

## 2017-09-28 DIAGNOSIS — I10 Essential (primary) hypertension: Secondary | ICD-10-CM | POA: Diagnosis not present

## 2017-11-05 DIAGNOSIS — G4733 Obstructive sleep apnea (adult) (pediatric): Secondary | ICD-10-CM | POA: Diagnosis not present

## 2017-11-18 DIAGNOSIS — G4733 Obstructive sleep apnea (adult) (pediatric): Secondary | ICD-10-CM | POA: Diagnosis not present

## 2017-12-12 ENCOUNTER — Other Ambulatory Visit: Payer: Self-pay | Admitting: Geriatric Medicine

## 2017-12-12 ENCOUNTER — Ambulatory Visit
Admission: RE | Admit: 2017-12-12 | Discharge: 2017-12-12 | Disposition: A | Discharge status: Home / Self Care | Payer: Medicare Other | Source: Ambulatory Visit | Attending: Geriatric Medicine | Admitting: Geriatric Medicine

## 2017-12-12 DIAGNOSIS — Z79899 Other long term (current) drug therapy: Secondary | ICD-10-CM | POA: Diagnosis not present

## 2017-12-12 DIAGNOSIS — R194 Change in bowel habit: Secondary | ICD-10-CM | POA: Diagnosis not present

## 2017-12-12 DIAGNOSIS — I10 Essential (primary) hypertension: Secondary | ICD-10-CM | POA: Diagnosis not present

## 2017-12-12 DIAGNOSIS — R5383 Other fatigue: Secondary | ICD-10-CM | POA: Diagnosis not present

## 2017-12-12 DIAGNOSIS — M7989 Other specified soft tissue disorders: Secondary | ICD-10-CM | POA: Diagnosis not present

## 2017-12-12 DIAGNOSIS — R52 Pain, unspecified: Secondary | ICD-10-CM

## 2017-12-12 DIAGNOSIS — M79662 Pain in left lower leg: Secondary | ICD-10-CM | POA: Diagnosis not present

## 2017-12-19 DIAGNOSIS — G4733 Obstructive sleep apnea (adult) (pediatric): Secondary | ICD-10-CM | POA: Diagnosis not present

## 2017-12-27 DIAGNOSIS — R194 Change in bowel habit: Secondary | ICD-10-CM | POA: Diagnosis not present

## 2018-01-19 DIAGNOSIS — G4733 Obstructive sleep apnea (adult) (pediatric): Secondary | ICD-10-CM | POA: Diagnosis not present

## 2018-02-13 DIAGNOSIS — R600 Localized edema: Secondary | ICD-10-CM | POA: Diagnosis not present

## 2018-02-13 DIAGNOSIS — I10 Essential (primary) hypertension: Secondary | ICD-10-CM | POA: Diagnosis not present

## 2018-02-13 DIAGNOSIS — G4733 Obstructive sleep apnea (adult) (pediatric): Secondary | ICD-10-CM | POA: Diagnosis not present

## 2018-02-22 DIAGNOSIS — L821 Other seborrheic keratosis: Secondary | ICD-10-CM | POA: Diagnosis not present

## 2018-02-22 DIAGNOSIS — L820 Inflamed seborrheic keratosis: Secondary | ICD-10-CM | POA: Diagnosis not present

## 2018-02-22 DIAGNOSIS — B078 Other viral warts: Secondary | ICD-10-CM | POA: Diagnosis not present

## 2018-03-29 DIAGNOSIS — I10 Essential (primary) hypertension: Secondary | ICD-10-CM | POA: Diagnosis not present

## 2018-04-20 DIAGNOSIS — I10 Essential (primary) hypertension: Secondary | ICD-10-CM | POA: Diagnosis not present

## 2018-04-20 DIAGNOSIS — R102 Pelvic and perineal pain: Secondary | ICD-10-CM | POA: Diagnosis not present

## 2018-05-16 IMAGING — CR DG CHEST 2V
2 series · 2 of 2 positions shown · non-contrast
Comparison: 01/24/2007

CLINICAL DATA: Mid chest pain and shortness of breath tonight.

EXAM:
CHEST  2 VIEW

[chest pa]
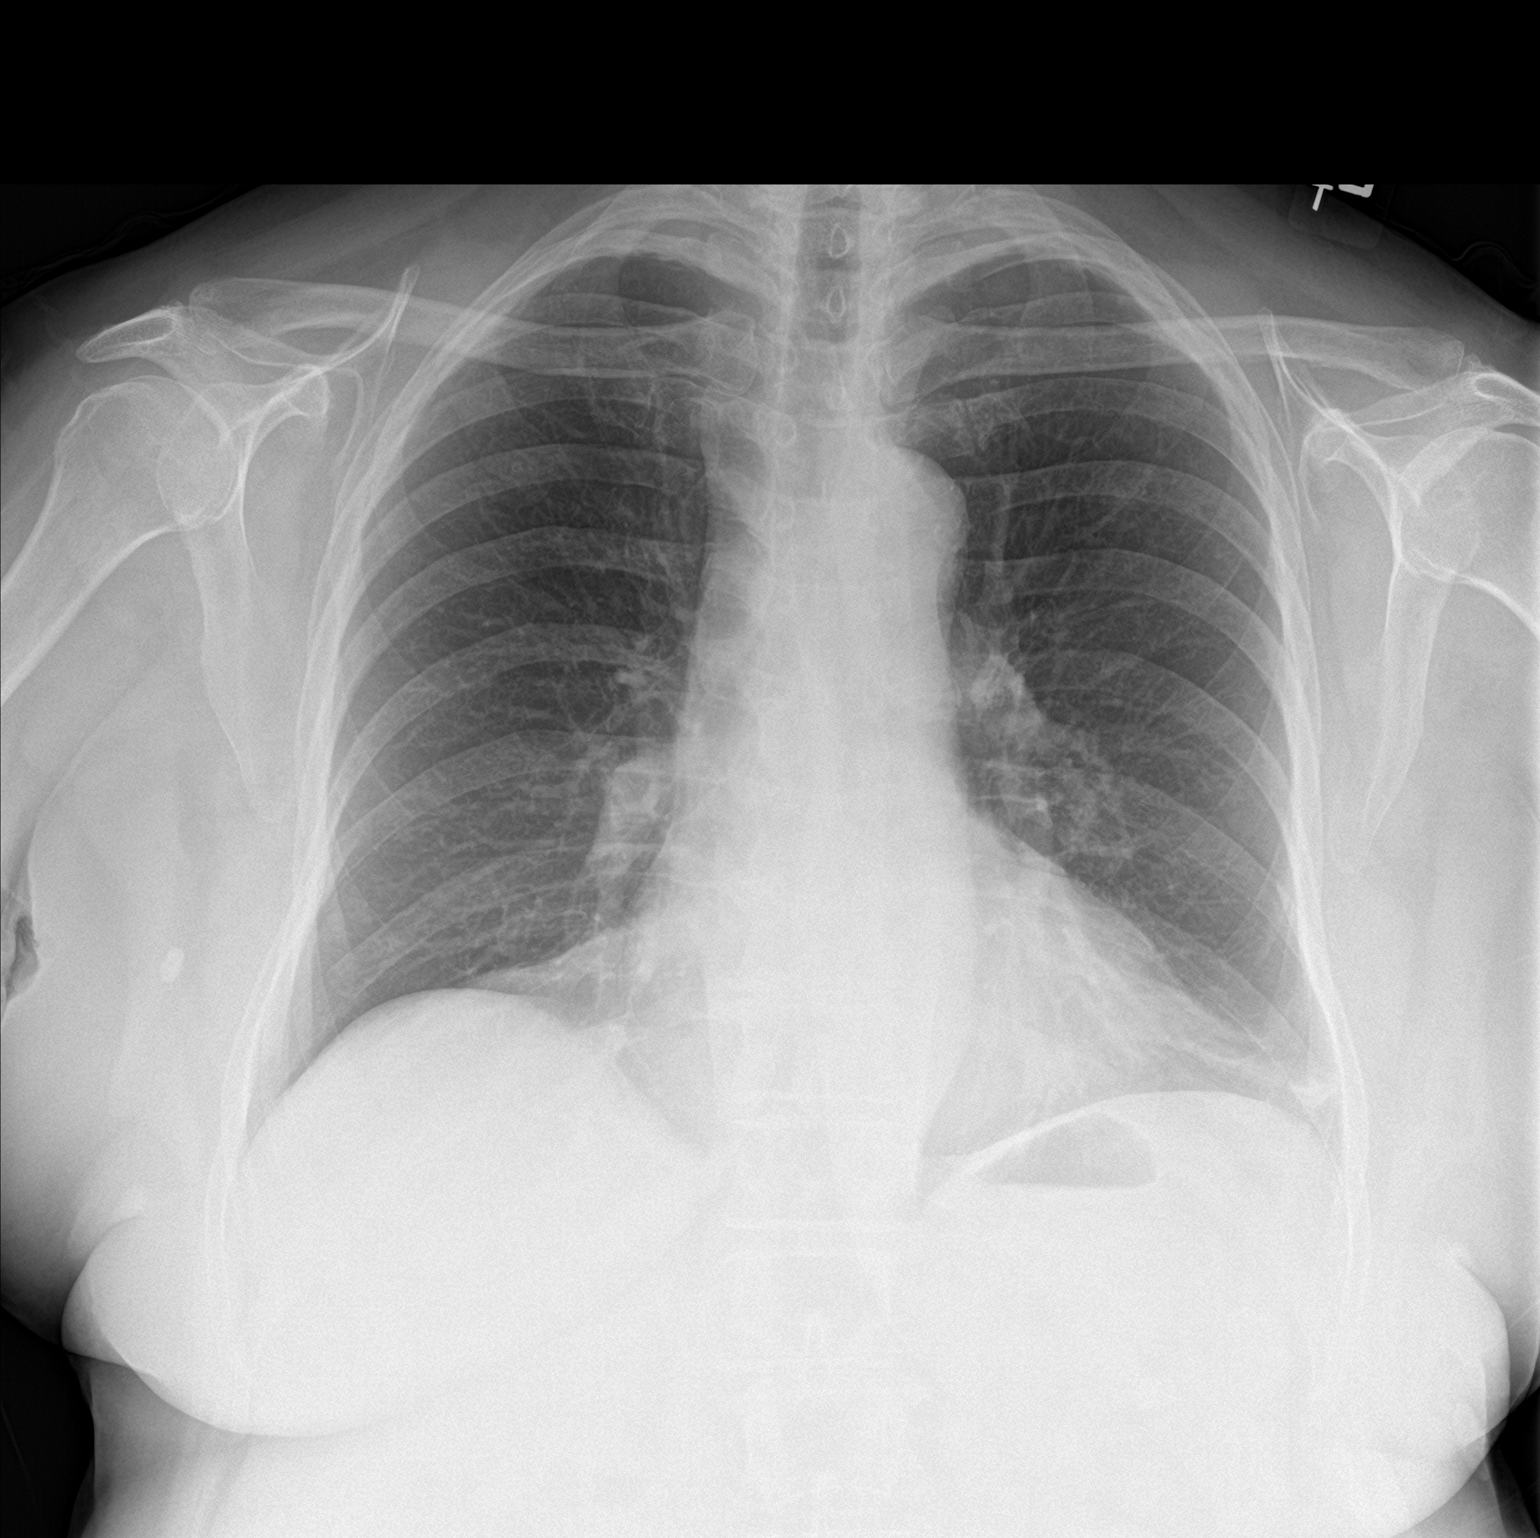

[chest lat]
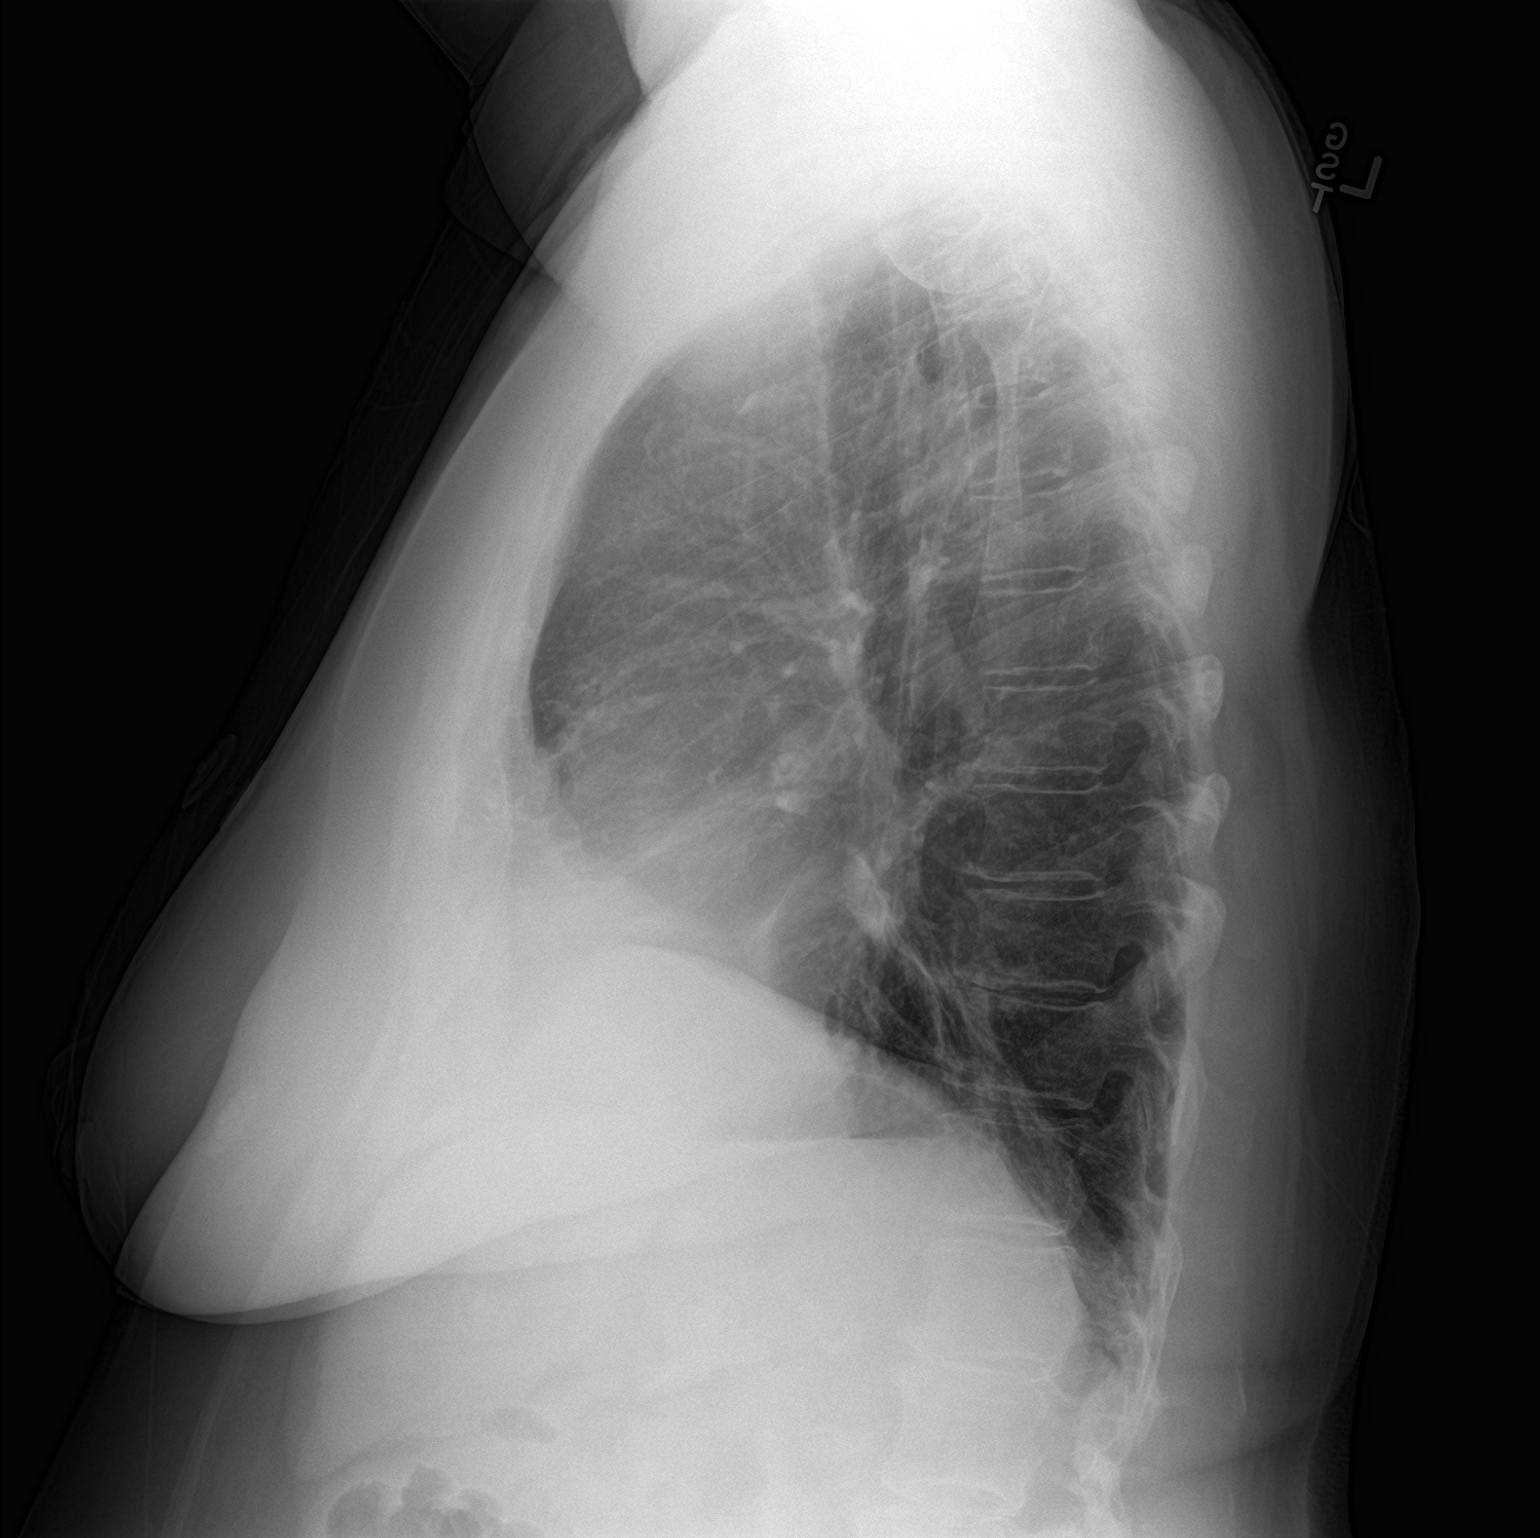

[2 of 2 positions shown; findings below may reference images not displayed]

FINDINGS: Slight fibrosis in the lung bases is similar previous study. Heart
size and pulmonary vascularity are normal. No airspace disease or
consolidation in the lungs. No blunting of costophrenic angles. No
pneumothorax. Mediastinal contours appear intact.
IMPRESSION: Linear fibrosis in the lung bases similar prior study. No evidence
of active pulmonary disease.

## 2018-05-22 DIAGNOSIS — M8589 Other specified disorders of bone density and structure, multiple sites: Secondary | ICD-10-CM | POA: Diagnosis not present

## 2018-05-22 DIAGNOSIS — Z96653 Presence of artificial knee joint, bilateral: Secondary | ICD-10-CM | POA: Diagnosis not present

## 2018-05-22 DIAGNOSIS — Z803 Family history of malignant neoplasm of breast: Secondary | ICD-10-CM | POA: Diagnosis not present

## 2018-05-22 DIAGNOSIS — Z1231 Encounter for screening mammogram for malignant neoplasm of breast: Secondary | ICD-10-CM | POA: Diagnosis not present

## 2018-06-02 DIAGNOSIS — G4733 Obstructive sleep apnea (adult) (pediatric): Secondary | ICD-10-CM | POA: Diagnosis not present

## 2018-06-10 DIAGNOSIS — G4733 Obstructive sleep apnea (adult) (pediatric): Secondary | ICD-10-CM | POA: Diagnosis not present

## 2018-06-15 DIAGNOSIS — M85852 Other specified disorders of bone density and structure, left thigh: Secondary | ICD-10-CM | POA: Diagnosis not present

## 2018-06-15 DIAGNOSIS — M85851 Other specified disorders of bone density and structure, right thigh: Secondary | ICD-10-CM | POA: Diagnosis not present

## 2018-06-21 DIAGNOSIS — G4733 Obstructive sleep apnea (adult) (pediatric): Secondary | ICD-10-CM | POA: Diagnosis not present

## 2018-11-30 DIAGNOSIS — G4733 Obstructive sleep apnea (adult) (pediatric): Secondary | ICD-10-CM | POA: Diagnosis not present

## 2019-04-11 DIAGNOSIS — R142 Eructation: Secondary | ICD-10-CM | POA: Diagnosis not present

## 2019-04-11 DIAGNOSIS — R5383 Other fatigue: Secondary | ICD-10-CM | POA: Diagnosis not present

## 2019-04-11 DIAGNOSIS — G4733 Obstructive sleep apnea (adult) (pediatric): Secondary | ICD-10-CM | POA: Diagnosis not present

## 2019-04-11 DIAGNOSIS — R079 Chest pain, unspecified: Secondary | ICD-10-CM | POA: Diagnosis not present

## 2019-04-16 DIAGNOSIS — L570 Actinic keratosis: Secondary | ICD-10-CM | POA: Diagnosis not present

## 2019-04-16 DIAGNOSIS — B078 Other viral warts: Secondary | ICD-10-CM | POA: Diagnosis not present

## 2019-04-16 DIAGNOSIS — I788 Other diseases of capillaries: Secondary | ICD-10-CM | POA: Diagnosis not present

## 2019-04-16 DIAGNOSIS — L821 Other seborrheic keratosis: Secondary | ICD-10-CM | POA: Diagnosis not present

## 2019-05-28 DIAGNOSIS — Z1231 Encounter for screening mammogram for malignant neoplasm of breast: Secondary | ICD-10-CM | POA: Diagnosis not present

## 2019-06-21 DIAGNOSIS — E7800 Pure hypercholesterolemia, unspecified: Secondary | ICD-10-CM | POA: Diagnosis not present

## 2019-06-21 DIAGNOSIS — I700 Atherosclerosis of aorta: Secondary | ICD-10-CM | POA: Diagnosis not present

## 2019-06-21 DIAGNOSIS — I10 Essential (primary) hypertension: Secondary | ICD-10-CM | POA: Diagnosis not present

## 2019-06-21 DIAGNOSIS — Z6835 Body mass index (BMI) 35.0-35.9, adult: Secondary | ICD-10-CM | POA: Diagnosis not present

## 2019-07-11 DIAGNOSIS — R195 Other fecal abnormalities: Secondary | ICD-10-CM | POA: Diagnosis not present

## 2019-07-11 DIAGNOSIS — R1010 Upper abdominal pain, unspecified: Secondary | ICD-10-CM | POA: Diagnosis not present

## 2019-07-11 DIAGNOSIS — R194 Change in bowel habit: Secondary | ICD-10-CM | POA: Diagnosis not present

## 2019-07-11 DIAGNOSIS — K625 Hemorrhage of anus and rectum: Secondary | ICD-10-CM | POA: Diagnosis not present

## 2019-07-24 DIAGNOSIS — L718 Other rosacea: Secondary | ICD-10-CM | POA: Diagnosis not present

## 2019-07-24 DIAGNOSIS — L820 Inflamed seborrheic keratosis: Secondary | ICD-10-CM | POA: Diagnosis not present

## 2019-07-24 DIAGNOSIS — X32XXXD Exposure to sunlight, subsequent encounter: Secondary | ICD-10-CM | POA: Diagnosis not present

## 2019-07-24 DIAGNOSIS — L570 Actinic keratosis: Secondary | ICD-10-CM | POA: Diagnosis not present

## 2019-08-16 DIAGNOSIS — Z1159 Encounter for screening for other viral diseases: Secondary | ICD-10-CM | POA: Diagnosis not present

## 2019-08-17 DIAGNOSIS — J309 Allergic rhinitis, unspecified: Secondary | ICD-10-CM | POA: Diagnosis not present

## 2019-08-22 DIAGNOSIS — D120 Benign neoplasm of cecum: Secondary | ICD-10-CM | POA: Diagnosis not present

## 2019-08-22 DIAGNOSIS — R1010 Upper abdominal pain, unspecified: Secondary | ICD-10-CM | POA: Diagnosis not present

## 2019-08-22 DIAGNOSIS — K635 Polyp of colon: Secondary | ICD-10-CM | POA: Diagnosis not present

## 2019-08-22 DIAGNOSIS — D122 Benign neoplasm of ascending colon: Secondary | ICD-10-CM | POA: Diagnosis not present

## 2019-08-22 DIAGNOSIS — K625 Hemorrhage of anus and rectum: Secondary | ICD-10-CM | POA: Diagnosis not present

## 2019-08-22 DIAGNOSIS — K317 Polyp of stomach and duodenum: Secondary | ICD-10-CM | POA: Diagnosis not present

## 2019-08-22 DIAGNOSIS — K6289 Other specified diseases of anus and rectum: Secondary | ICD-10-CM | POA: Diagnosis not present

## 2019-08-22 DIAGNOSIS — K3189 Other diseases of stomach and duodenum: Secondary | ICD-10-CM | POA: Diagnosis not present

## 2019-08-22 DIAGNOSIS — R197 Diarrhea, unspecified: Secondary | ICD-10-CM | POA: Diagnosis not present

## 2019-08-22 DIAGNOSIS — K648 Other hemorrhoids: Secondary | ICD-10-CM | POA: Diagnosis not present

## 2019-08-22 DIAGNOSIS — K5730 Diverticulosis of large intestine without perforation or abscess without bleeding: Secondary | ICD-10-CM | POA: Diagnosis not present

## 2019-08-22 DIAGNOSIS — K621 Rectal polyp: Secondary | ICD-10-CM | POA: Diagnosis not present

## 2019-08-27 DIAGNOSIS — K635 Polyp of colon: Secondary | ICD-10-CM | POA: Diagnosis not present

## 2019-08-27 DIAGNOSIS — D122 Benign neoplasm of ascending colon: Secondary | ICD-10-CM | POA: Diagnosis not present

## 2019-08-27 DIAGNOSIS — D120 Benign neoplasm of cecum: Secondary | ICD-10-CM | POA: Diagnosis not present

## 2019-08-27 DIAGNOSIS — K621 Rectal polyp: Secondary | ICD-10-CM | POA: Diagnosis not present

## 2019-09-04 DIAGNOSIS — K642 Third degree hemorrhoids: Secondary | ICD-10-CM | POA: Diagnosis not present

## 2019-09-14 DIAGNOSIS — L03116 Cellulitis of left lower limb: Secondary | ICD-10-CM | POA: Diagnosis not present

## 2019-09-17 DIAGNOSIS — K642 Third degree hemorrhoids: Secondary | ICD-10-CM | POA: Diagnosis not present

## 2019-10-01 DIAGNOSIS — I10 Essential (primary) hypertension: Secondary | ICD-10-CM | POA: Diagnosis not present

## 2019-10-01 DIAGNOSIS — Z0000 Encounter for general adult medical examination without abnormal findings: Secondary | ICD-10-CM | POA: Diagnosis not present

## 2019-10-01 DIAGNOSIS — I700 Atherosclerosis of aorta: Secondary | ICD-10-CM | POA: Diagnosis not present

## 2019-10-01 DIAGNOSIS — G4733 Obstructive sleep apnea (adult) (pediatric): Secondary | ICD-10-CM | POA: Diagnosis not present

## 2019-10-01 DIAGNOSIS — Z1389 Encounter for screening for other disorder: Secondary | ICD-10-CM | POA: Diagnosis not present

## 2019-10-29 DIAGNOSIS — K642 Third degree hemorrhoids: Secondary | ICD-10-CM | POA: Diagnosis not present

## 2019-12-03 DIAGNOSIS — K642 Third degree hemorrhoids: Secondary | ICD-10-CM | POA: Diagnosis not present

## 2019-12-04 DIAGNOSIS — B078 Other viral warts: Secondary | ICD-10-CM | POA: Diagnosis not present

## 2019-12-04 DIAGNOSIS — L258 Unspecified contact dermatitis due to other agents: Secondary | ICD-10-CM | POA: Diagnosis not present

## 2020-03-26 DIAGNOSIS — B078 Other viral warts: Secondary | ICD-10-CM | POA: Diagnosis not present

## 2020-03-31 ENCOUNTER — Other Ambulatory Visit: Payer: Self-pay | Admitting: Geriatric Medicine

## 2020-03-31 DIAGNOSIS — R531 Weakness: Secondary | ICD-10-CM | POA: Diagnosis not present

## 2020-03-31 DIAGNOSIS — J3089 Other allergic rhinitis: Secondary | ICD-10-CM | POA: Diagnosis not present

## 2020-03-31 DIAGNOSIS — Z79899 Other long term (current) drug therapy: Secondary | ICD-10-CM | POA: Diagnosis not present

## 2020-03-31 DIAGNOSIS — R197 Diarrhea, unspecified: Secondary | ICD-10-CM | POA: Diagnosis not present

## 2020-03-31 DIAGNOSIS — R1032 Left lower quadrant pain: Secondary | ICD-10-CM

## 2020-04-16 DIAGNOSIS — B078 Other viral warts: Secondary | ICD-10-CM | POA: Diagnosis not present

## 2020-04-17 ENCOUNTER — Other Ambulatory Visit: Payer: Self-pay

## 2020-04-17 ENCOUNTER — Ambulatory Visit
Admission: RE | Admit: 2020-04-17 | Discharge: 2020-04-17 | Disposition: A | Discharge status: Home / Self Care | Payer: Medicare Other | Source: Ambulatory Visit | Attending: Geriatric Medicine | Admitting: Geriatric Medicine

## 2020-04-17 DIAGNOSIS — R1032 Left lower quadrant pain: Secondary | ICD-10-CM

## 2020-04-17 DIAGNOSIS — K429 Umbilical hernia without obstruction or gangrene: Secondary | ICD-10-CM | POA: Diagnosis not present

## 2020-04-17 DIAGNOSIS — R197 Diarrhea, unspecified: Secondary | ICD-10-CM | POA: Diagnosis not present

## 2020-04-17 DIAGNOSIS — K5730 Diverticulosis of large intestine without perforation or abscess without bleeding: Secondary | ICD-10-CM | POA: Diagnosis not present

## 2020-04-17 DIAGNOSIS — N281 Cyst of kidney, acquired: Secondary | ICD-10-CM | POA: Diagnosis not present

## 2020-04-17 MED ORDER — IOPAMIDOL (ISOVUE-300) INJECTION 61%
100.0000 mL | Freq: Once | INTRAVENOUS | Status: AC | PRN
  Administered 2020-04-17: 100 mL via INTRAVENOUS

## 2020-05-12 DIAGNOSIS — I10 Essential (primary) hypertension: Secondary | ICD-10-CM | POA: Diagnosis not present

## 2020-05-12 DIAGNOSIS — Z23 Encounter for immunization: Secondary | ICD-10-CM | POA: Diagnosis not present

## 2020-10-03 DIAGNOSIS — Z0000 Encounter for general adult medical examination without abnormal findings: Secondary | ICD-10-CM | POA: Diagnosis not present

## 2020-10-03 DIAGNOSIS — I10 Essential (primary) hypertension: Secondary | ICD-10-CM | POA: Diagnosis not present

## 2020-10-03 DIAGNOSIS — Z1389 Encounter for screening for other disorder: Secondary | ICD-10-CM | POA: Diagnosis not present

## 2020-10-03 DIAGNOSIS — I700 Atherosclerosis of aorta: Secondary | ICD-10-CM | POA: Diagnosis not present

## 2020-10-03 DIAGNOSIS — G4733 Obstructive sleep apnea (adult) (pediatric): Secondary | ICD-10-CM | POA: Diagnosis not present

## 2020-10-03 DIAGNOSIS — K9089 Other intestinal malabsorption: Secondary | ICD-10-CM | POA: Diagnosis not present

## 2021-04-28 DIAGNOSIS — F439 Reaction to severe stress, unspecified: Secondary | ICD-10-CM | POA: Diagnosis not present

## 2021-04-28 DIAGNOSIS — I10 Essential (primary) hypertension: Secondary | ICD-10-CM | POA: Diagnosis not present

## 2021-04-28 DIAGNOSIS — Z23 Encounter for immunization: Secondary | ICD-10-CM | POA: Diagnosis not present

## 2021-06-30 ENCOUNTER — Ambulatory Visit
Admission: RE | Admit: 2021-06-30 | Discharge: 2021-06-30 | Disposition: A | Discharge status: Home / Self Care | Payer: Medicare Other | Source: Ambulatory Visit | Attending: Internal Medicine | Admitting: Internal Medicine

## 2021-06-30 ENCOUNTER — Other Ambulatory Visit: Payer: Self-pay

## 2021-06-30 ENCOUNTER — Other Ambulatory Visit: Payer: Self-pay | Admitting: Internal Medicine

## 2021-06-30 DIAGNOSIS — R109 Unspecified abdominal pain: Secondary | ICD-10-CM

## 2021-06-30 DIAGNOSIS — R300 Dysuria: Secondary | ICD-10-CM | POA: Diagnosis not present

## 2021-06-30 DIAGNOSIS — R319 Hematuria, unspecified: Secondary | ICD-10-CM | POA: Diagnosis not present

## 2021-06-30 DIAGNOSIS — K5730 Diverticulosis of large intestine without perforation or abscess without bleeding: Secondary | ICD-10-CM | POA: Diagnosis not present

## 2021-07-01 ENCOUNTER — Other Ambulatory Visit: Payer: Self-pay | Admitting: Geriatric Medicine

## 2021-07-01 DIAGNOSIS — R109 Unspecified abdominal pain: Secondary | ICD-10-CM

## 2021-07-22 DIAGNOSIS — B029 Zoster without complications: Secondary | ICD-10-CM | POA: Diagnosis not present

## 2021-07-22 DIAGNOSIS — R21 Rash and other nonspecific skin eruption: Secondary | ICD-10-CM | POA: Diagnosis not present

## 2021-09-15 IMAGING — CT CT ABD-PELV W/ CM
1 of 3 series · 14 of 32 positions shown, 19 images · IV contrast (APPLIED)
Comparison: 12/01/2009

CLINICAL DATA: Abdominal pain and diarrhea 6 months. Previous
partial colectomy.

EXAM:
CT ABDOMEN AND PELVIS WITH CONTRAST
TECHNIQUE: Multidetector CT imaging of the abdomen and pelvis was performed
using the standard protocol following bolus administration of
intravenous contrast.
CONTRAST:  100mL PJPH64-LGG IOPAMIDOL (PJPH64-LGG) INJECTION 61%

[Series 2: abd/pelvis w/cm · axial · 0.97mm/px · z∈[-457,-77]mm · 14 of 88 slices shown, 19 images]
[im 6/88  soft-tissue]
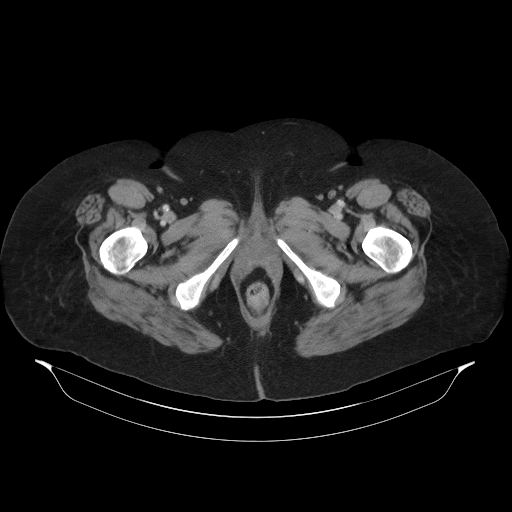
[im 6/88  bone]
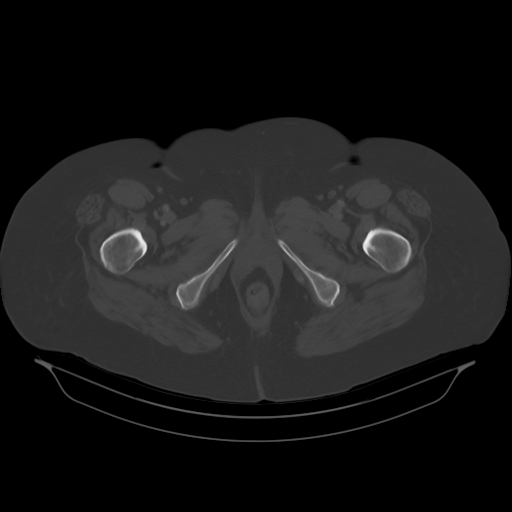
[im 11/88  soft-tissue]
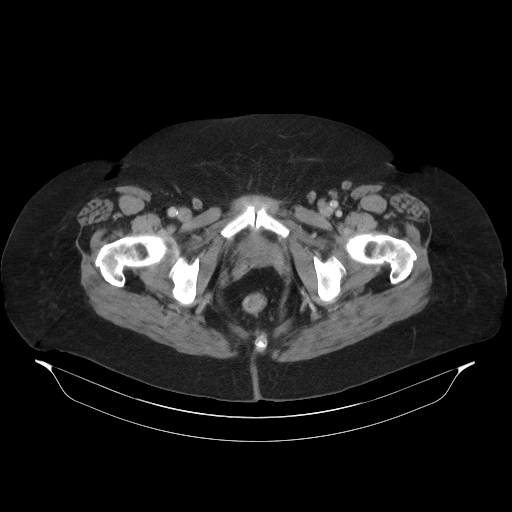
[im 17/88  soft-tissue]
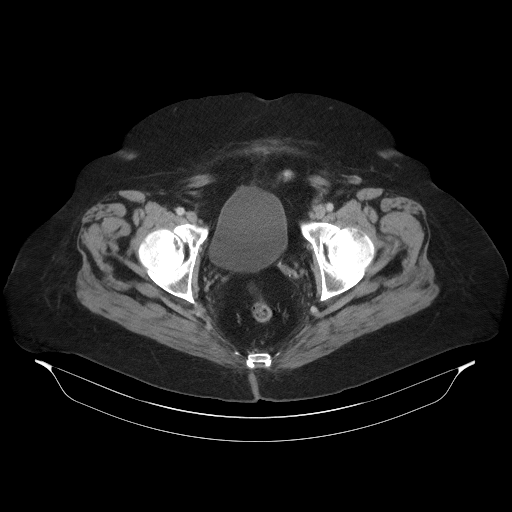
[im 28/88  soft-tissue]
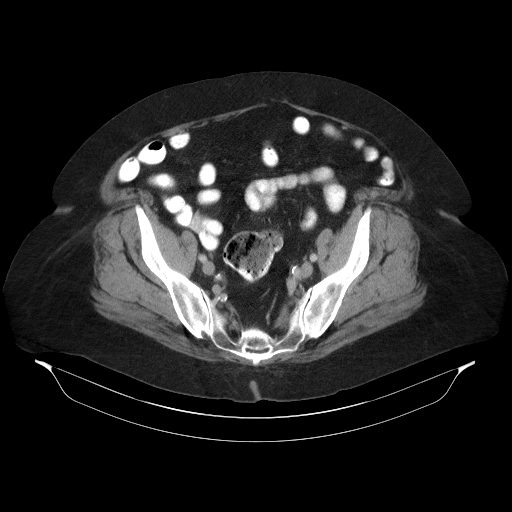
[im 33/88  soft-tissue]
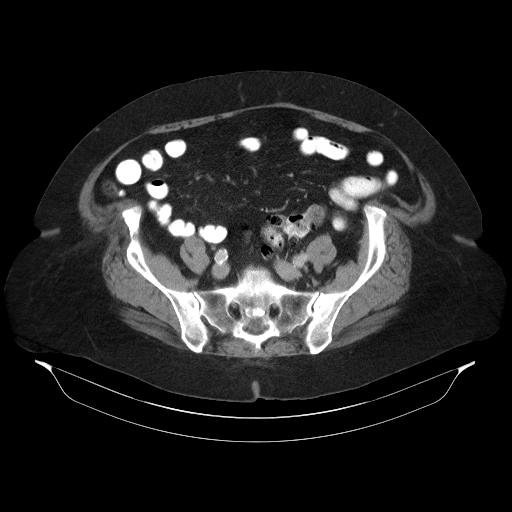
[im 39/88  soft-tissue]
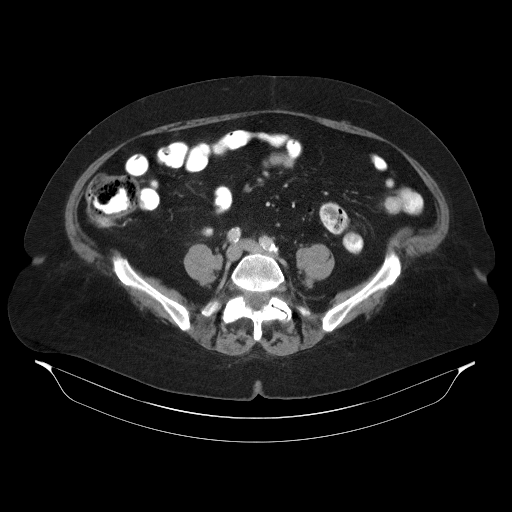
[im 44/88  soft-tissue]
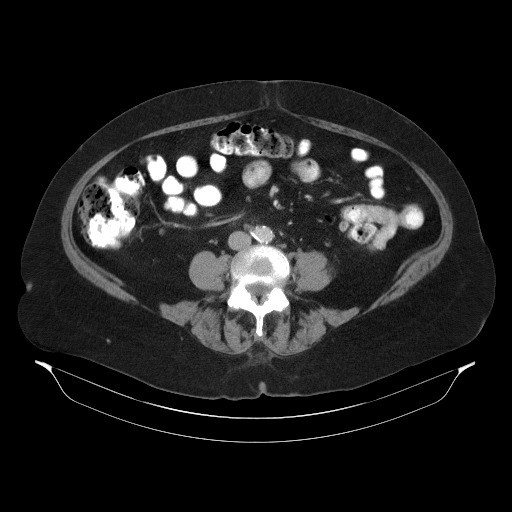
[im 49/88  soft-tissue]
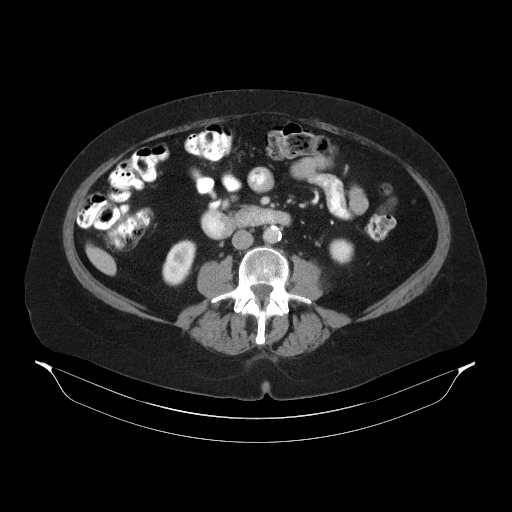
[im 55/88  soft-tissue]
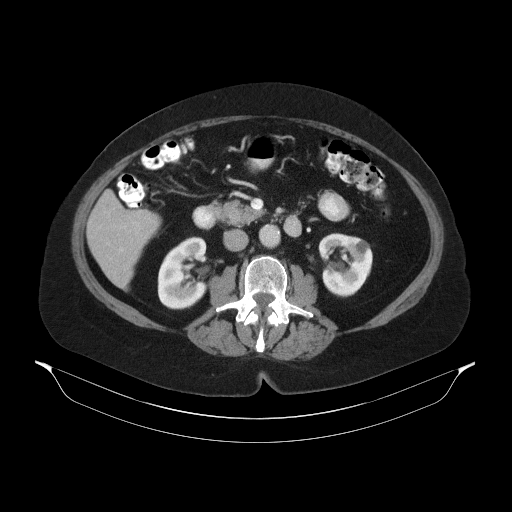
[im 55/88  bone]
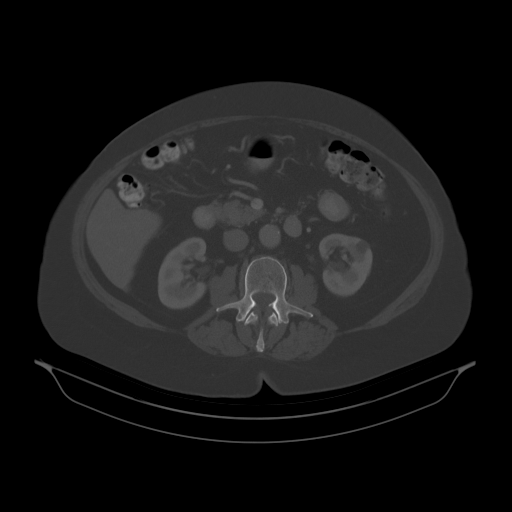
[im 60/88  soft-tissue]
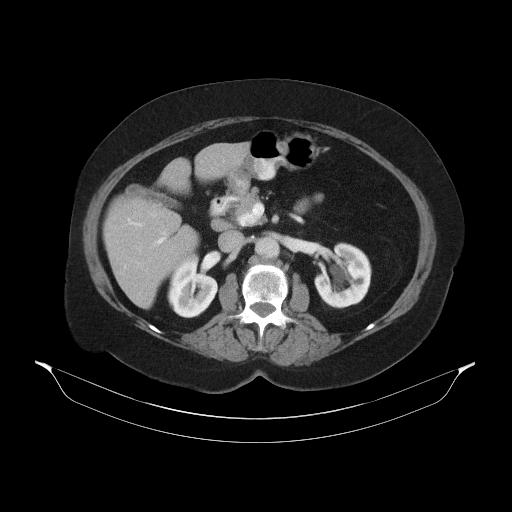
[im 66/88  lung]
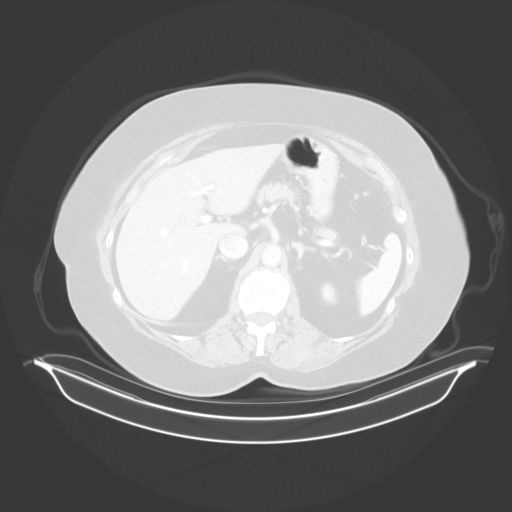
[im 71/88  soft-tissue]
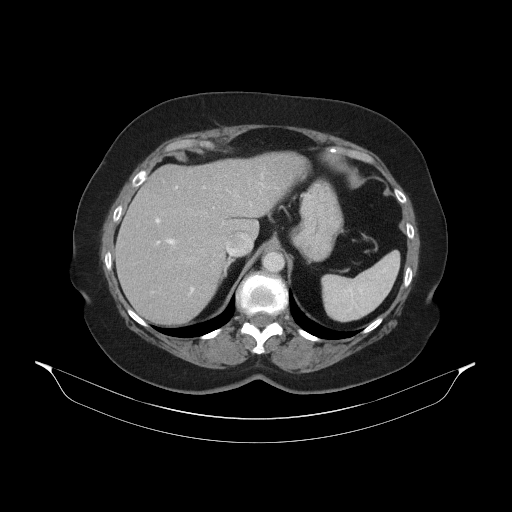
[im 71/88  lung]
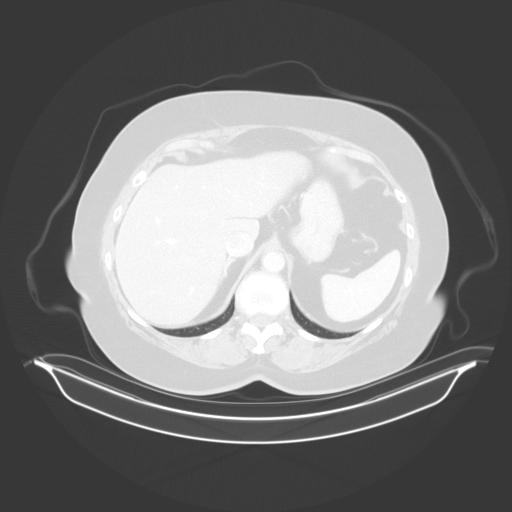
[im 77/88  soft-tissue]
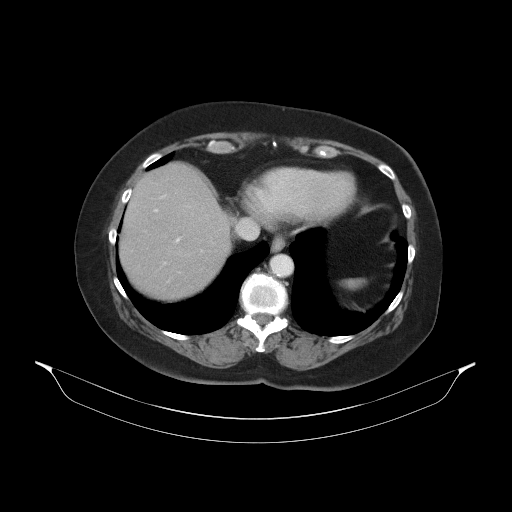
[im 77/88  lung]
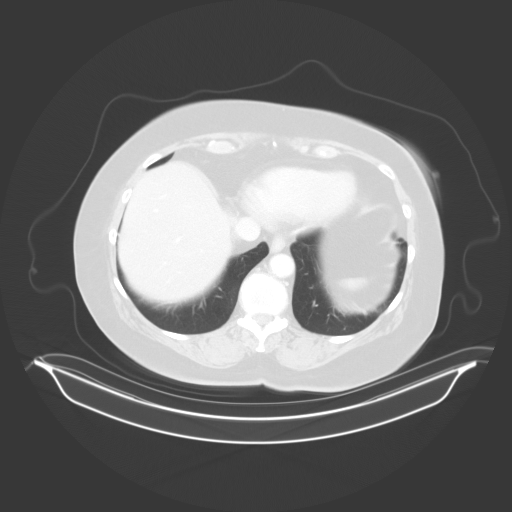
[im 82/88  soft-tissue]
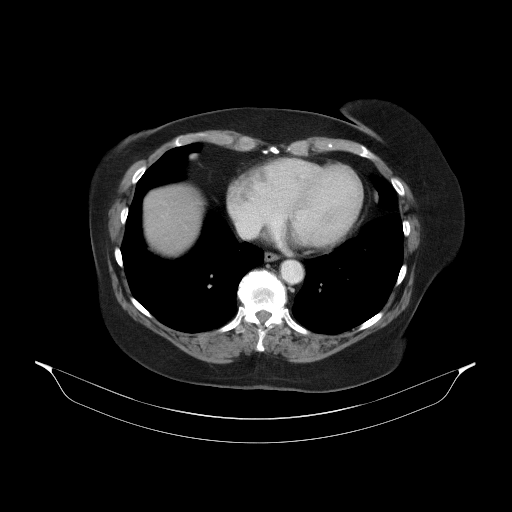
[im 82/88  lung]
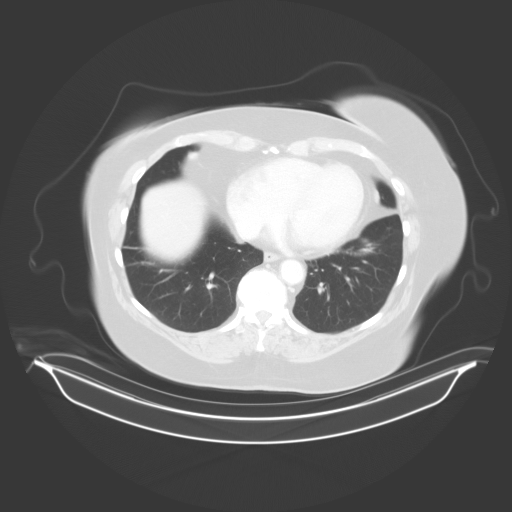

[14 of 32 positions shown; findings below may reference images not displayed]

FINDINGS: Lower chest: Mild linear atelectasis/scarring in the lung bases.

Hepatobiliary: Liver, gallbladder and biliary tree are normal.

Pancreas: Normal.

Spleen: Normal.

Adrenals/Urinary Tract: Adrenal glands are normal. Kidneys are
normal in size. 1 cm hypodensity over the mid to lower pole left
kidney too small to characterize but likely a cyst. Few small
left-sided parapelvic renal cysts. Ureters and bladder are normal.

Stomach/Bowel: Stomach and small bowel are normal. Appendix not
visualized. Mild diverticulosis of the sigmoid colon.

Vascular/Lymphatic: Minimal calcified plaque over the abdominal
aorta which is normal in caliber. No evidence of adenopathy.

Reproductive: Previous hysterectomy.

Other: No free fluid or focal inflammatory change. Tiny umbilical
hernia containing only peritoneal fat.

Musculoskeletal: Mild degenerate change of the spine and hips.
IMPRESSION: 1. No acute findings in the abdomen/pelvis.
2. Mild sigmoid diverticulosis.
3. 1 cm left renal cortical hypodensity too small to characterize
but likely a cyst. Few small left-sided parapelvic renal cysts.
4. Aortic atherosclerosis. Tiny umbilical hernia containing only
peritoneal fat.

Aortic Atherosclerosis ([id number]-[id number].U).

## 2021-10-09 DIAGNOSIS — L249 Irritant contact dermatitis, unspecified cause: Secondary | ICD-10-CM | POA: Diagnosis not present

## 2021-10-09 DIAGNOSIS — L299 Pruritus, unspecified: Secondary | ICD-10-CM | POA: Diagnosis not present

## 2021-12-15 DIAGNOSIS — Z0000 Encounter for general adult medical examination without abnormal findings: Secondary | ICD-10-CM | POA: Diagnosis not present

## 2021-12-15 DIAGNOSIS — Z13220 Encounter for screening for lipoid disorders: Secondary | ICD-10-CM | POA: Diagnosis not present

## 2021-12-15 DIAGNOSIS — I10 Essential (primary) hypertension: Secondary | ICD-10-CM | POA: Diagnosis not present

## 2021-12-15 DIAGNOSIS — Z1329 Encounter for screening for other suspected endocrine disorder: Secondary | ICD-10-CM | POA: Diagnosis not present

## 2021-12-15 DIAGNOSIS — R143 Flatulence: Secondary | ICD-10-CM | POA: Diagnosis not present

## 2021-12-15 DIAGNOSIS — Z1389 Encounter for screening for other disorder: Secondary | ICD-10-CM | POA: Diagnosis not present

## 2022-03-29 DIAGNOSIS — R0981 Nasal congestion: Secondary | ICD-10-CM | POA: Diagnosis not present

## 2022-03-29 DIAGNOSIS — J189 Pneumonia, unspecified organism: Secondary | ICD-10-CM | POA: Diagnosis not present

## 2022-03-29 DIAGNOSIS — R051 Acute cough: Secondary | ICD-10-CM | POA: Diagnosis not present

## 2022-06-24 DIAGNOSIS — R300 Dysuria: Secondary | ICD-10-CM | POA: Diagnosis not present

## 2022-06-24 DIAGNOSIS — I10 Essential (primary) hypertension: Secondary | ICD-10-CM | POA: Diagnosis not present

## 2022-06-24 DIAGNOSIS — Z0000 Encounter for general adult medical examination without abnormal findings: Secondary | ICD-10-CM | POA: Diagnosis not present

## 2022-06-24 DIAGNOSIS — Z23 Encounter for immunization: Secondary | ICD-10-CM | POA: Diagnosis not present

## 2022-06-24 DIAGNOSIS — G8929 Other chronic pain: Secondary | ICD-10-CM | POA: Diagnosis not present

## 2022-06-24 DIAGNOSIS — M546 Pain in thoracic spine: Secondary | ICD-10-CM | POA: Diagnosis not present

## 2022-06-25 DIAGNOSIS — G8929 Other chronic pain: Secondary | ICD-10-CM | POA: Diagnosis not present

## 2022-06-25 DIAGNOSIS — M546 Pain in thoracic spine: Secondary | ICD-10-CM | POA: Diagnosis not present

## 2022-06-25 DIAGNOSIS — N281 Cyst of kidney, acquired: Secondary | ICD-10-CM | POA: Diagnosis not present

## 2022-06-25 DIAGNOSIS — R8290 Unspecified abnormal findings in urine: Secondary | ICD-10-CM | POA: Diagnosis not present

## 2022-06-25 DIAGNOSIS — R319 Hematuria, unspecified: Secondary | ICD-10-CM | POA: Diagnosis not present

## 2022-06-25 DIAGNOSIS — R109 Unspecified abdominal pain: Secondary | ICD-10-CM | POA: Diagnosis not present

## 2022-06-25 DIAGNOSIS — R300 Dysuria: Secondary | ICD-10-CM | POA: Diagnosis not present

## 2022-06-25 DIAGNOSIS — K5730 Diverticulosis of large intestine without perforation or abscess without bleeding: Secondary | ICD-10-CM | POA: Diagnosis not present

## 2022-06-29 DIAGNOSIS — R319 Hematuria, unspecified: Secondary | ICD-10-CM | POA: Diagnosis not present

## 2022-07-30 DIAGNOSIS — Z1231 Encounter for screening mammogram for malignant neoplasm of breast: Secondary | ICD-10-CM | POA: Diagnosis not present

## 2022-07-30 DIAGNOSIS — M85852 Other specified disorders of bone density and structure, left thigh: Secondary | ICD-10-CM | POA: Diagnosis not present

## 2022-07-30 DIAGNOSIS — Z780 Asymptomatic menopausal state: Secondary | ICD-10-CM | POA: Diagnosis not present

## 2022-08-13 DIAGNOSIS — R109 Unspecified abdominal pain: Secondary | ICD-10-CM | POA: Diagnosis not present

## 2022-08-23 DIAGNOSIS — N390 Urinary tract infection, site not specified: Secondary | ICD-10-CM | POA: Diagnosis not present

## 2022-08-23 DIAGNOSIS — R319 Hematuria, unspecified: Secondary | ICD-10-CM | POA: Diagnosis not present

## 2022-08-23 DIAGNOSIS — N3289 Other specified disorders of bladder: Secondary | ICD-10-CM | POA: Diagnosis not present

## 2022-08-23 DIAGNOSIS — K5730 Diverticulosis of large intestine without perforation or abscess without bleeding: Secondary | ICD-10-CM | POA: Diagnosis not present

## 2022-09-21 DIAGNOSIS — I10 Essential (primary) hypertension: Secondary | ICD-10-CM | POA: Diagnosis not present

## 2022-09-21 DIAGNOSIS — R053 Chronic cough: Secondary | ICD-10-CM | POA: Diagnosis not present

## 2022-09-21 DIAGNOSIS — R9389 Abnormal findings on diagnostic imaging of other specified body structures: Secondary | ICD-10-CM | POA: Diagnosis not present

## 2022-09-21 DIAGNOSIS — R059 Cough, unspecified: Secondary | ICD-10-CM | POA: Diagnosis not present

## 2022-09-21 DIAGNOSIS — R7989 Other specified abnormal findings of blood chemistry: Secondary | ICD-10-CM | POA: Diagnosis not present

## 2022-10-05 DIAGNOSIS — R9389 Abnormal findings on diagnostic imaging of other specified body structures: Secondary | ICD-10-CM | POA: Diagnosis not present

## 2022-10-05 DIAGNOSIS — R918 Other nonspecific abnormal finding of lung field: Secondary | ICD-10-CM | POA: Diagnosis not present

## 2022-11-10 DIAGNOSIS — Z79899 Other long term (current) drug therapy: Secondary | ICD-10-CM | POA: Diagnosis not present

## 2022-11-10 DIAGNOSIS — R7989 Other specified abnormal findings of blood chemistry: Secondary | ICD-10-CM | POA: Diagnosis not present

## 2022-11-10 DIAGNOSIS — R053 Chronic cough: Secondary | ICD-10-CM | POA: Diagnosis not present

## 2022-11-11 DIAGNOSIS — I081 Rheumatic disorders of both mitral and tricuspid valves: Secondary | ICD-10-CM | POA: Diagnosis not present

## 2022-11-24 DIAGNOSIS — I10 Essential (primary) hypertension: Secondary | ICD-10-CM | POA: Diagnosis not present

## 2022-11-24 DIAGNOSIS — G4733 Obstructive sleep apnea (adult) (pediatric): Secondary | ICD-10-CM | POA: Diagnosis not present

## 2022-11-24 DIAGNOSIS — R911 Solitary pulmonary nodule: Secondary | ICD-10-CM | POA: Diagnosis not present

## 2022-11-24 DIAGNOSIS — I728 Aneurysm of other specified arteries: Secondary | ICD-10-CM | POA: Diagnosis not present

## 2022-11-28 IMAGING — CT CT ABD-PELV W/O CM
2 of 4 series · 12 of 46 positions shown, 14 images · non-contrast
Comparison: April 17, 2020.

CLINICAL DATA: Acute right lower quadrant abdominal pain.



[Series 2: routine abdomen pelvis without 5.00 br40 s3 axial · axial · non-contrast · 0.62mm/px · z∈[+1252,+1647]mm · 9 of 95 slices shown, 11 images]
[im 8/95  soft-tissue]
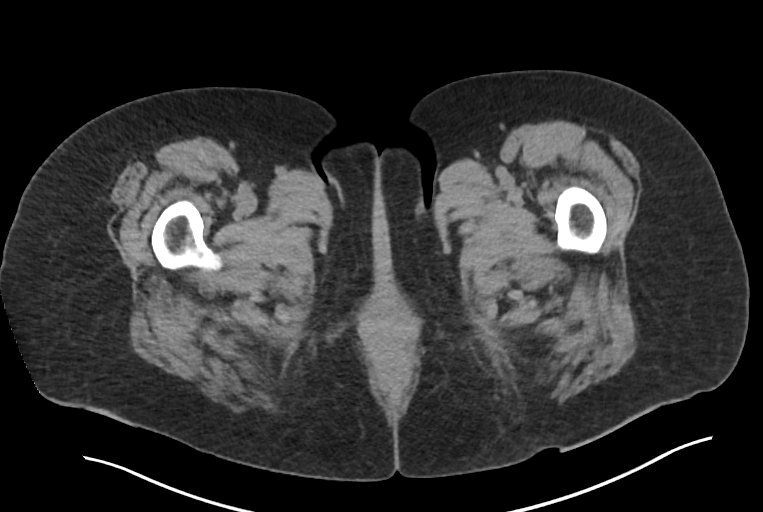
[im 8/95  bone]
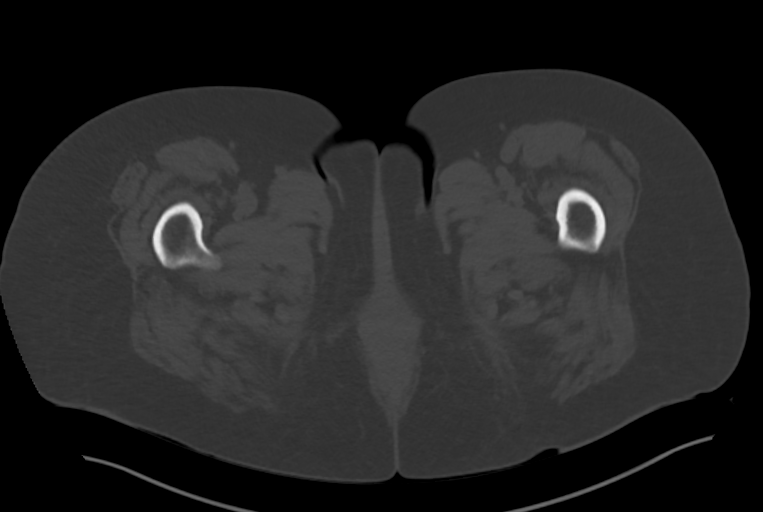
[im 19/95  soft-tissue]
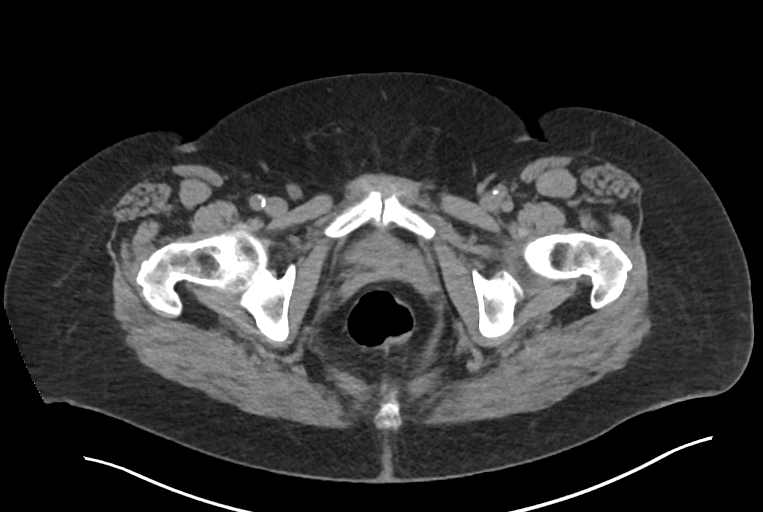
[im 27/95  soft-tissue]
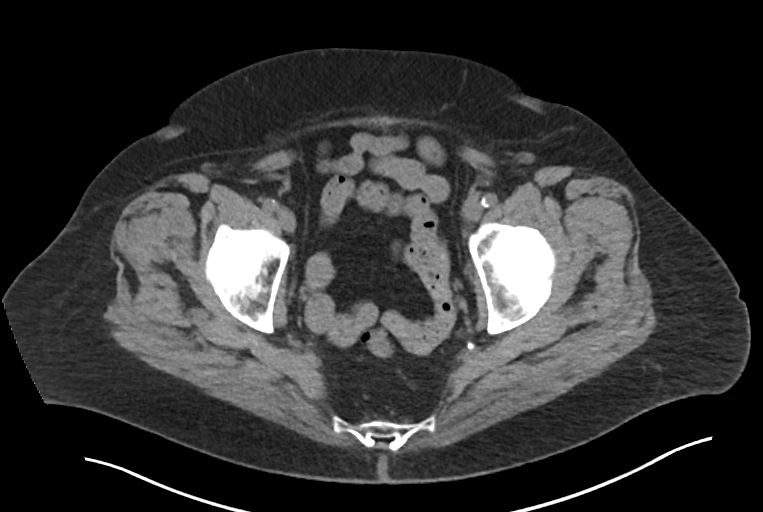
[im 38/95  soft-tissue]
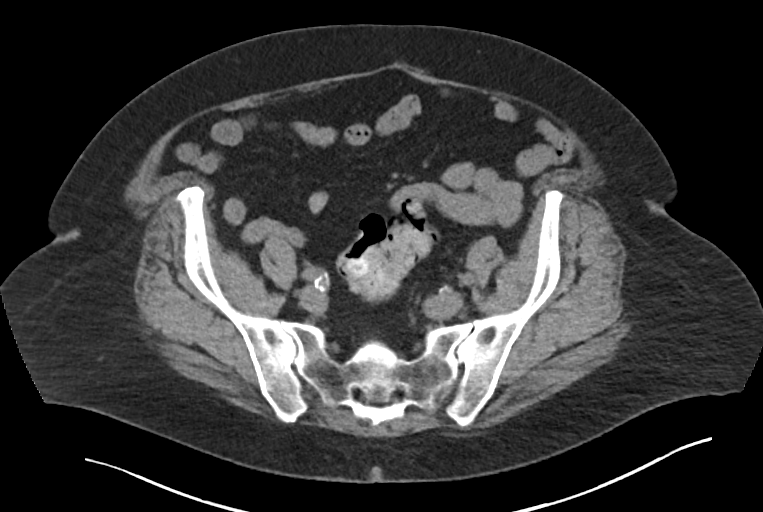
[im 49/95  soft-tissue]
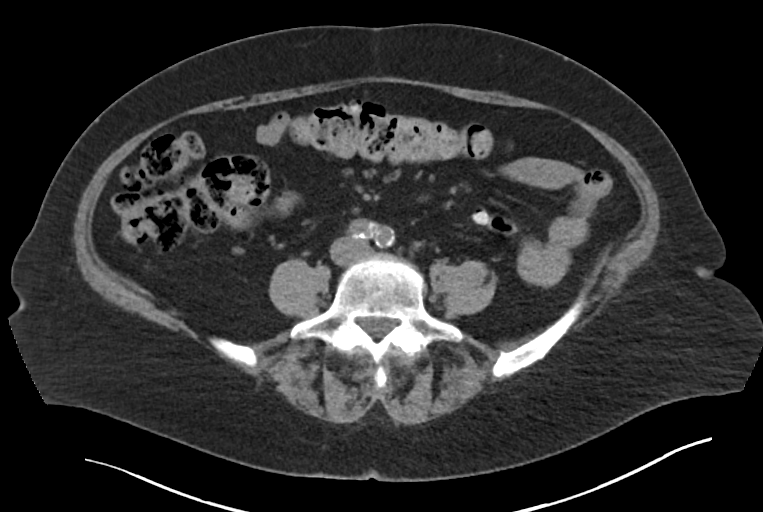
[im 57/95  soft-tissue]
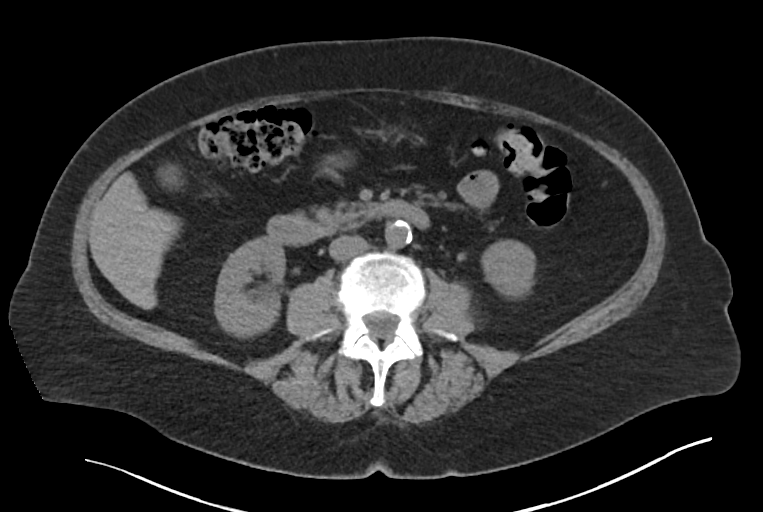
[im 68/95  soft-tissue]
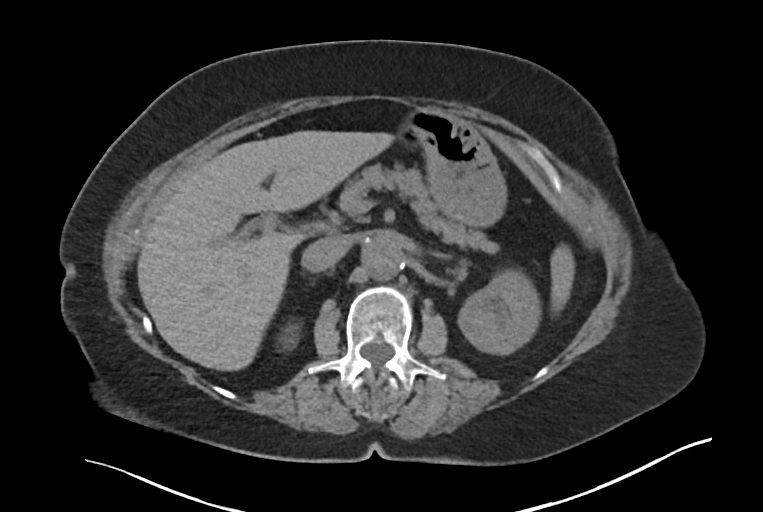
[im 79/95  soft-tissue]
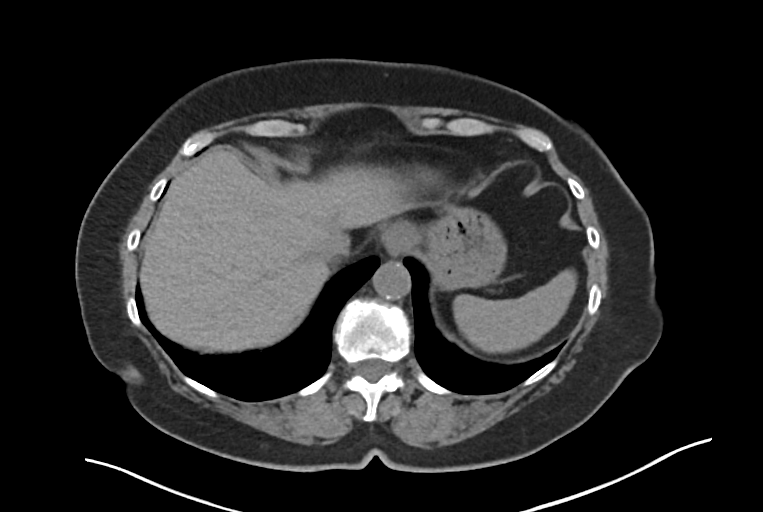
[im 87/95  soft-tissue]
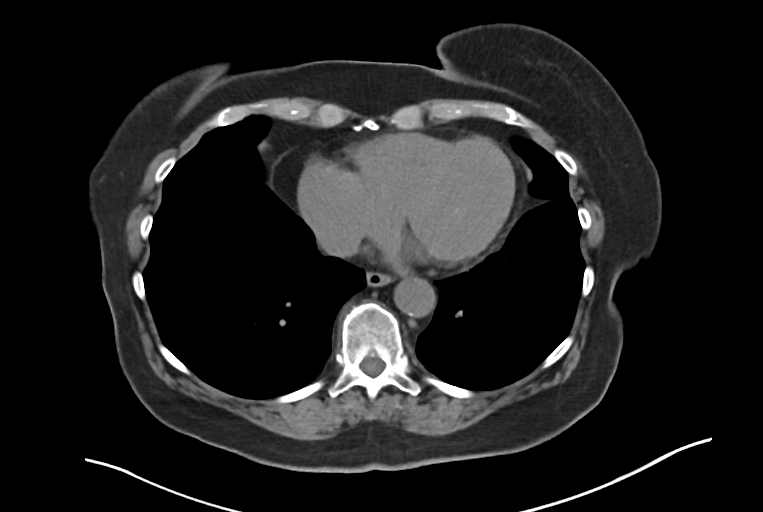
[im 87/95  bone]
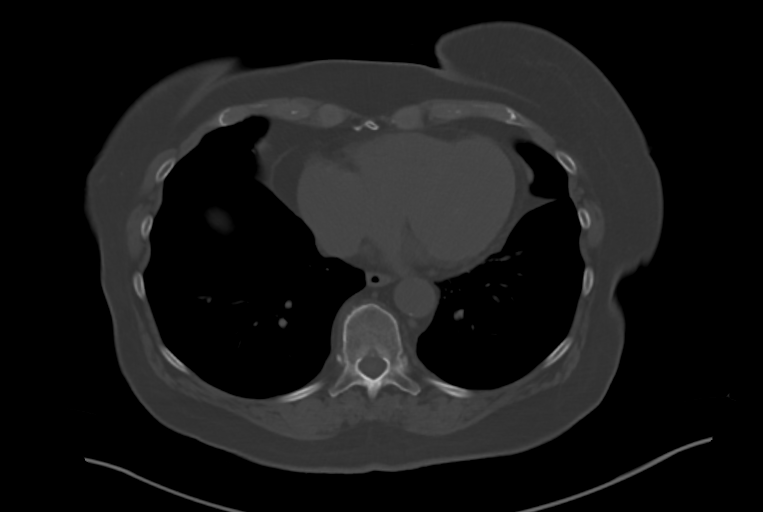

[Series 4: routine abdomen pelvis without 2.00 br40 s3 cor · coronal · non-contrast · 0.91mm/px · 3 of 159 slices shown]
[im 53/159  soft-tissue]
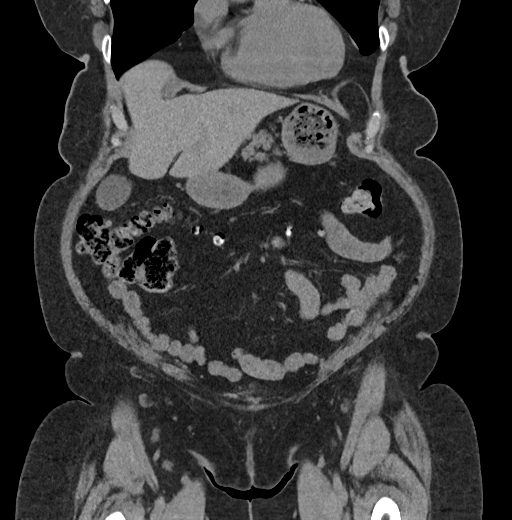
[im 71/159  soft-tissue]
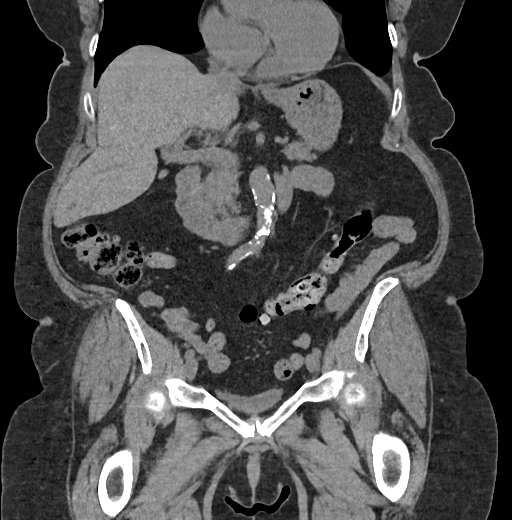
[im 88/159  soft-tissue]
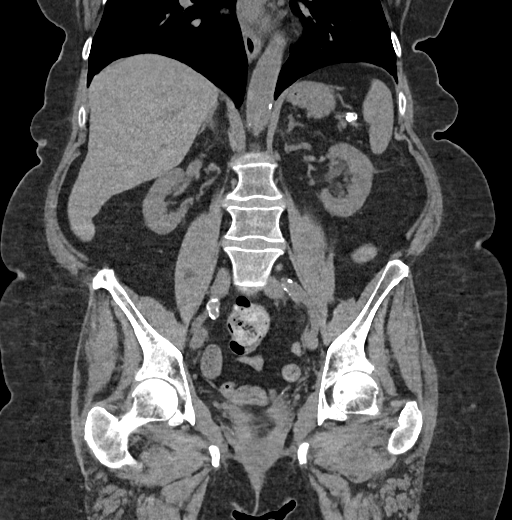

[12 of 46 positions shown; findings below may reference images not displayed]

FINDINGS: Lower chest: No acute abnormality.

Hepatobiliary: No focal liver abnormality is seen. No gallstones,
gallbladder wall thickening, or biliary dilatation.

Pancreas: Unremarkable. No pancreatic ductal dilatation or
surrounding inflammatory changes.

Spleen: Normal in size without focal abnormality.

Adrenals/Urinary Tract: Adrenal glands appear normal. Stable left
renal cyst is noted. No hydronephrosis or renal obstruction is
noted. No renal or ureteral calculi are noted. Urinary bladder is
decompressed.

Stomach/Bowel: The stomach is unremarkable. There is no evidence of
bowel obstruction or inflammation. Sigmoid diverticulosis is noted
without inflammation. Stool is noted throughout the colon.

Vascular/Lymphatic: Aortic atherosclerosis. No enlarged abdominal or
pelvic lymph nodes.

Reproductive: Status post hysterectomy. No adnexal masses.

Other: Small periumbilical hernia is noted.  No ascites is noted.

Musculoskeletal: No acute or significant osseous findings.
IMPRESSION: Sigmoid diverticulosis without inflammation.

Small fat containing periumbilical ventral hernia.

No acute abnormality seen in the abdomen or pelvis.

Aortic Atherosclerosis ([id number]).

## 2022-12-20 DIAGNOSIS — I728 Aneurysm of other specified arteries: Secondary | ICD-10-CM | POA: Diagnosis not present

## 2022-12-30 DIAGNOSIS — R079 Chest pain, unspecified: Secondary | ICD-10-CM | POA: Diagnosis not present

## 2023-01-26 DIAGNOSIS — K219 Gastro-esophageal reflux disease without esophagitis: Secondary | ICD-10-CM | POA: Diagnosis not present

## 2023-01-26 DIAGNOSIS — E782 Mixed hyperlipidemia: Secondary | ICD-10-CM | POA: Diagnosis not present

## 2023-01-26 DIAGNOSIS — I10 Essential (primary) hypertension: Secondary | ICD-10-CM | POA: Diagnosis not present

## 2023-03-01 DIAGNOSIS — R9389 Abnormal findings on diagnostic imaging of other specified body structures: Secondary | ICD-10-CM | POA: Diagnosis not present

## 2023-03-01 DIAGNOSIS — I10 Essential (primary) hypertension: Secondary | ICD-10-CM | POA: Diagnosis not present

## 2023-03-01 DIAGNOSIS — R0602 Shortness of breath: Secondary | ICD-10-CM | POA: Diagnosis not present

## 2023-03-09 DIAGNOSIS — R9389 Abnormal findings on diagnostic imaging of other specified body structures: Secondary | ICD-10-CM | POA: Diagnosis not present

## 2023-03-09 DIAGNOSIS — I728 Aneurysm of other specified arteries: Secondary | ICD-10-CM | POA: Diagnosis not present

## 2023-03-09 DIAGNOSIS — R0602 Shortness of breath: Secondary | ICD-10-CM | POA: Diagnosis not present

## 2023-03-09 DIAGNOSIS — R918 Other nonspecific abnormal finding of lung field: Secondary | ICD-10-CM | POA: Diagnosis not present

## 2023-03-09 DIAGNOSIS — R590 Localized enlarged lymph nodes: Secondary | ICD-10-CM | POA: Diagnosis not present

## 2023-03-16 DIAGNOSIS — K219 Gastro-esophageal reflux disease without esophagitis: Secondary | ICD-10-CM | POA: Diagnosis not present

## 2023-03-16 DIAGNOSIS — R911 Solitary pulmonary nodule: Secondary | ICD-10-CM | POA: Diagnosis not present

## 2023-03-16 DIAGNOSIS — R053 Chronic cough: Secondary | ICD-10-CM | POA: Diagnosis not present

## 2023-03-16 DIAGNOSIS — J4540 Moderate persistent asthma, uncomplicated: Secondary | ICD-10-CM | POA: Diagnosis not present

## 2023-03-16 DIAGNOSIS — Z9189 Other specified personal risk factors, not elsewhere classified: Secondary | ICD-10-CM | POA: Diagnosis not present

## 2023-03-22 DIAGNOSIS — I10 Essential (primary) hypertension: Secondary | ICD-10-CM | POA: Diagnosis not present

## 2023-03-22 DIAGNOSIS — R252 Cramp and spasm: Secondary | ICD-10-CM | POA: Diagnosis not present

## 2023-03-29 DIAGNOSIS — R002 Palpitations: Secondary | ICD-10-CM | POA: Diagnosis not present

## 2023-03-29 DIAGNOSIS — I10 Essential (primary) hypertension: Secondary | ICD-10-CM | POA: Diagnosis not present

## 2023-03-29 DIAGNOSIS — Z0000 Encounter for general adult medical examination without abnormal findings: Secondary | ICD-10-CM | POA: Diagnosis not present

## 2023-03-29 DIAGNOSIS — I493 Ventricular premature depolarization: Secondary | ICD-10-CM | POA: Diagnosis not present

## 2023-03-29 DIAGNOSIS — E782 Mixed hyperlipidemia: Secondary | ICD-10-CM | POA: Diagnosis not present

## 2023-03-29 DIAGNOSIS — I440 Atrioventricular block, first degree: Secondary | ICD-10-CM | POA: Diagnosis not present

## 2023-03-30 DIAGNOSIS — I440 Atrioventricular block, first degree: Secondary | ICD-10-CM | POA: Diagnosis not present

## 2023-03-30 DIAGNOSIS — I493 Ventricular premature depolarization: Secondary | ICD-10-CM | POA: Diagnosis not present

## 2023-04-12 DIAGNOSIS — R002 Palpitations: Secondary | ICD-10-CM | POA: Diagnosis not present

## 2023-04-12 DIAGNOSIS — I10 Essential (primary) hypertension: Secondary | ICD-10-CM | POA: Diagnosis not present

## 2023-04-18 DIAGNOSIS — K219 Gastro-esophageal reflux disease without esophagitis: Secondary | ICD-10-CM | POA: Diagnosis not present

## 2023-04-18 DIAGNOSIS — J4489 Other specified chronic obstructive pulmonary disease: Secondary | ICD-10-CM | POA: Diagnosis not present

## 2023-04-18 DIAGNOSIS — R911 Solitary pulmonary nodule: Secondary | ICD-10-CM | POA: Diagnosis not present

## 2023-04-18 DIAGNOSIS — R0609 Other forms of dyspnea: Secondary | ICD-10-CM | POA: Diagnosis not present

## 2023-04-26 DIAGNOSIS — I10 Essential (primary) hypertension: Secondary | ICD-10-CM | POA: Diagnosis not present

## 2023-05-03 DIAGNOSIS — I10 Essential (primary) hypertension: Secondary | ICD-10-CM | POA: Diagnosis not present

## 2023-05-13 DIAGNOSIS — R079 Chest pain, unspecified: Secondary | ICD-10-CM | POA: Diagnosis not present

## 2023-05-13 DIAGNOSIS — I10 Essential (primary) hypertension: Secondary | ICD-10-CM | POA: Diagnosis not present

## 2023-05-13 DIAGNOSIS — R0609 Other forms of dyspnea: Secondary | ICD-10-CM | POA: Diagnosis not present

## 2023-05-15 DIAGNOSIS — R079 Chest pain, unspecified: Secondary | ICD-10-CM | POA: Diagnosis not present

## 2023-05-15 DIAGNOSIS — R001 Bradycardia, unspecified: Secondary | ICD-10-CM | POA: Diagnosis not present
